# Patient Record
Sex: Female | Born: 1980 | Race: White | Hispanic: No | Marital: Married | Smoking: Never smoker
Health system: Southern US, Community
[De-identification: ages and names within clinical notes are randomized; demographics above are authoritative.]

## PROBLEM LIST (undated history)

## (undated) ENCOUNTER — Inpatient Hospital Stay (HOSPITAL_COMMUNITY): Payer: Self-pay

## (undated) DIAGNOSIS — Z789 Other specified health status: Secondary | ICD-10-CM

## (undated) DIAGNOSIS — Z87442 Personal history of urinary calculi: Secondary | ICD-10-CM

## (undated) HISTORY — PX: NO PAST SURGERIES: SHX2092

---

## 2012-03-15 LAB — OB RESULTS CONSOLE GC/CHLAMYDIA
Chlamydia: NEGATIVE
Gonorrhea: NEGATIVE

## 2012-03-15 LAB — OB RESULTS CONSOLE HIV ANTIBODY (ROUTINE TESTING): HIV: NONREACTIVE

## 2012-03-15 LAB — OB RESULTS CONSOLE RPR: RPR: NONREACTIVE

## 2012-03-15 LAB — OB RESULTS CONSOLE ANTIBODY SCREEN: Antibody Screen: NEGATIVE

## 2012-07-09 ENCOUNTER — Inpatient Hospital Stay (HOSPITAL_COMMUNITY): Payer: BC Managed Care – PPO

## 2012-07-09 ENCOUNTER — Encounter (HOSPITAL_COMMUNITY): Payer: Self-pay | Admitting: *Deleted

## 2012-07-09 ENCOUNTER — Observation Stay (HOSPITAL_COMMUNITY)
Admission: AD | Admit: 2012-07-09 | Discharge: 2012-07-11 | DRG: 886 | Disposition: A | Discharge status: Home / Self Care | Payer: BC Managed Care – PPO | Source: Ambulatory Visit | Attending: Obstetrics and Gynecology | Admitting: Obstetrics and Gynecology

## 2012-07-09 DIAGNOSIS — M545 Low back pain, unspecified: Secondary | ICD-10-CM | POA: Diagnosis present

## 2012-07-09 DIAGNOSIS — O26839 Pregnancy related renal disease, unspecified trimester: Principal | ICD-10-CM | POA: Diagnosis present

## 2012-07-09 DIAGNOSIS — N1330 Unspecified hydronephrosis: Secondary | ICD-10-CM | POA: Diagnosis present

## 2012-07-09 DIAGNOSIS — N200 Calculus of kidney: Secondary | ICD-10-CM | POA: Diagnosis present

## 2012-07-09 DIAGNOSIS — R109 Unspecified abdominal pain: Secondary | ICD-10-CM | POA: Diagnosis present

## 2012-07-09 HISTORY — DX: Other specified health status: Z78.9

## 2012-07-09 LAB — COMPREHENSIVE METABOLIC PANEL
ALT: 16 U/L (ref 0–35)
Alkaline Phosphatase: 50 U/L (ref 39–117)
BUN: 10 mg/dL (ref 6–23)
CO2: 20 mEq/L (ref 19–32)
Calcium: 8.5 mg/dL (ref 8.4–10.5)
GFR calc Af Amer: >90 mL/min (ref >90)
GFR calc non Af Amer: >90 mL/min (ref >90)
Glucose, Bld: 97 mg/dL (ref 70–99)
Potassium: 4.1 mEq/L (ref 3.5–5.1)
Sodium: 134 mEq/L — ABNORMAL LOW (ref 135–145)
Total Protein: 6.4 g/dL (ref 6.0–8.3)

## 2012-07-09 LAB — CBC
Hemoglobin: 11.7 g/dL — ABNORMAL LOW (ref 12.0–15.0)
MCH: 31.2 pg (ref 26.0–34.0)
RBC: 3.75 MIL/uL — ABNORMAL LOW (ref 3.87–5.11)

## 2012-07-09 LAB — URINE MICROSCOPIC-ADD ON

## 2012-07-09 LAB — URINALYSIS, ROUTINE W REFLEX MICROSCOPIC
Bilirubin Urine: NEGATIVE
Glucose, UA: NEGATIVE mg/dL
Ketones, ur: NEGATIVE mg/dL
Protein, ur: 30 mg/dL — AB
pH: 7.5 (ref 5.0–8.0)

## 2012-07-09 MED ORDER — DEXTROSE 5 % IV SOLN
1.0000 g | Freq: Once | INTRAVENOUS | Status: AC
  Administered 2012-07-09: 1 g via INTRAVENOUS
  Filled 2012-07-09: qty 10

## 2012-07-09 MED ORDER — PRENATAL MULTIVITAMIN CH: 1.0000 | ORAL_TABLET | Freq: Every day | ORAL | Status: DC

## 2012-07-09 MED ORDER — DIPHENHYDRAMINE HCL 50 MG/ML IJ SOLN: 12.5000 mg | Freq: Four times a day (QID) | INTRAMUSCULAR | Status: DC | PRN

## 2012-07-09 MED ORDER — SODIUM CHLORIDE 0.9 % IJ SOLN: 9.0000 mL | INTRAMUSCULAR | Status: DC | PRN

## 2012-07-09 MED ORDER — HYDROMORPHONE 0.3 MG/ML IV SOLN
INTRAVENOUS | Status: DC
  Administered 2012-07-09: 0.4 mg via INTRAVENOUS
  Administered 2012-07-09: 16:00:00 via INTRAVENOUS
  Administered 2012-07-09: 0.599 mg via INTRAVENOUS
  Administered 2012-07-10: 0.199 mg via INTRAVENOUS
  Administered 2012-07-10 (×2): 0.2 mg via INTRAVENOUS
  Administered 2012-07-10: 0.3999 mg via INTRAVENOUS
  Administered 2012-07-10: 0.4 mg via INTRAVENOUS
  Filled 2012-07-09: qty 25

## 2012-07-09 MED ORDER — NALOXONE HCL 0.4 MG/ML IJ SOLN: 0.4000 mg | INTRAMUSCULAR | Status: DC | PRN

## 2012-07-09 MED ORDER — DEXTROSE 5 % IV SOLN: 1.0000 g | Freq: Two times a day (BID) | INTRAVENOUS | Status: DC

## 2012-07-09 MED ORDER — DIPHENHYDRAMINE HCL 12.5 MG/5ML PO ELIX
12.5000 mg | ORAL_SOLUTION | Freq: Four times a day (QID) | ORAL | Status: DC | PRN
  Filled 2012-07-09: qty 5

## 2012-07-09 MED ORDER — ACETAMINOPHEN 325 MG PO TABS: 650.0000 mg | ORAL_TABLET | ORAL | Status: DC | PRN

## 2012-07-09 MED ORDER — OXYCODONE-ACETAMINOPHEN 5-325 MG PO TABS
2.0000 | ORAL_TABLET | Freq: Once | ORAL | Status: AC
  Administered 2012-07-09: 2 via ORAL
  Filled 2012-07-09: qty 2

## 2012-07-09 MED ORDER — DOCUSATE SODIUM 100 MG PO CAPS: 100.0000 mg | ORAL_CAPSULE | Freq: Every day | ORAL | Status: DC

## 2012-07-09 MED ORDER — CALCIUM CARBONATE ANTACID 500 MG PO CHEW: 2.0000 | CHEWABLE_TABLET | ORAL | Status: DC | PRN

## 2012-07-09 MED ORDER — ONDANSETRON HCL 4 MG/2ML IJ SOLN: 4.0000 mg | Freq: Four times a day (QID) | INTRAMUSCULAR | Status: DC | PRN

## 2012-07-09 MED ORDER — SODIUM CHLORIDE 0.9 % IV SOLN
INTRAVENOUS | Status: DC
  Administered 2012-07-09 – 2012-07-11 (×6): via INTRAVENOUS

## 2012-07-09 MED ORDER — DEXTROSE 5 % IV SOLN
1.0000 g | Freq: Two times a day (BID) | INTRAVENOUS | Status: DC
  Administered 2012-07-09 – 2012-07-10 (×3): 1 g via INTRAVENOUS
  Filled 2012-07-09 (×4): qty 10

## 2012-07-09 MED ORDER — ZOLPIDEM TARTRATE 5 MG PO TABS: 5.0000 mg | ORAL_TABLET | Freq: Every evening | ORAL | Status: DC | PRN

## 2012-07-09 MED ORDER — LACTATED RINGERS IV BOLUS (SEPSIS)
1000.0000 mL | Freq: Once | INTRAVENOUS | Status: DC
  Administered 2012-07-09: 1000 mL via INTRAVENOUS

## 2012-07-09 MED ORDER — SODIUM CHLORIDE 0.9 % IV BOLUS (SEPSIS)
1000.0000 mL | Freq: Once | INTRAVENOUS | Status: AC
  Administered 2012-07-09: 1000 mL via INTRAVENOUS

## 2012-07-09 NOTE — H&P (Signed)
HPI  ptis [redacted]w[redacted]d pregnant G1P0 who presents with sudden onset of lower left back pain at 3 am. The pain localized over left hip with some radiation upwards and towards spine. The pt took 1 Tylenol this morning at 3 am without any relief and then vomited this morning. Pt denies spotting, bleeding, contractions or UTI symptoms. She last had IC 2 weeks ago. Pt does not have a history of back pain. The pain is actually slightly improved with walking. The pt has had an uncomplicated pregnancy.  Past Medical History   Diagnosis  Date   .  No pertinent past medical history     Past Surgical History   Procedure  Date   .  No past surgeries     History reviewed. No pertinent family history.  History   Substance Use Topics   .  Smoking status:  Never Smoker   .  Smokeless tobacco:  Not on file   .  Alcohol Use:  No    Allergies: No Known Allergies  Prescriptions prior to admission   Medication  Sig  Dispense  Refill   .  acetaminophen (TYLENOL) 500 MG tablet  Take 500 mg by mouth every 6 (six) hours as needed. pain     .  Prenatal Vit-Fe Fumarate-FA (PRENATAL MULTIVITAMIN) TABS  Take 1 tablet by mouth daily.      Review of Systems  Constitutional: Negative for fever and chills.  Gastrointestinal: Positive for nausea and vomiting. Negative for heartburn, abdominal pain, diarrhea and constipation.  Genitourinary: Negative for dysuria and flank pain.  Musculoskeletal: Positive for back pain.   Physical Exam   Blood pressure 132/82, pulse 87, temperature 97.9 F (36.6 C), temperature source Oral, resp. rate 18, height 5\' 3"  (1.6 m), weight 150 lb 12.8 oz (68.402 kg).  Physical Exam  Nursing note and vitals reviewed.  Constitutional: She is oriented to person, place, and time. She appears well-developed and well-nourished.  Very uncomfortable appearing  HENT:  Head: Normocephalic.  Eyes: Pupils are equal, round, and reactive to light.  Neck: Normal range of motion. Neck supple.   Cardiovascular: Normal rate.  Respiratory: Effort normal.  GI: Soft. She exhibits no distension. There is no tenderness. There is no rebound.  Left CVA tenderness No ctx noted FHR reassuring for gestational age  Genitourinary:  Cervix long and closed  Musculoskeletal: Normal range of motion.  Neurological: She is alert and oriented to person, place, and time.  Skin: Skin is warm and dry. There is pallor.  Psychiatric: She has a normal mood and affect.   MAU Course   Procedures  Results for orders placed during the hospital encounter of 07/09/12 (from the past 24 hour(s))   URINALYSIS, ROUTINE W REFLEX MICROSCOPIC Status: Abnormal    Collection Time    07/09/12 9:20 AM   Component  Value  Range    Color, Urine  YELLOW  YELLOW    APPearance  CLOUDY (*)  CLEAR    Specific Gravity, Urine  1.015  1.005 - 1.030    pH  7.5  5.0 - 8.0    Glucose, UA  NEGATIVE  NEGATIVE mg/dL    Hgb urine dipstick  LARGE (*)  NEGATIVE    Bilirubin Urine  NEGATIVE  NEGATIVE    Ketones, ur  NEGATIVE  NEGATIVE mg/dL    Protein, ur  30 (*)  NEGATIVE mg/dL    Urobilinogen, UA  1.0  0.0 - 1.0 mg/dL    Nitrite  NEGATIVE  NEGATIVE    Leukocytes, UA  MODERATE (*)  NEGATIVE   URINE MICROSCOPIC-ADD ON Status: Abnormal    Collection Time    07/09/12 9:20 AM   Component  Value  Range    Squamous Epithelial / LPF  MANY (*)  RARE    WBC, UA  7-10  <3 WBC/hpf    RBC / HPF  TOO NUMEROUS TO COUNT  <3 RBC/hpf    Bacteria, UA  MANY (*)  RARE   discussed with Dr. Rana Snare- will give Rocephin 1 gm IV and IV bolus of fluids and do renal ultrasound  Urine culture pending  RADIOLOGY REPORT*  Clinical Data: [redacted] weeks pregnant with right flank pain.  RENAL/URINARY TRACT ULTRASOUND COMPLETE  Comparison: None.  Findings:  Right Kidney: 10.4 cm in length. A 4.5 mm intra pole calculus is  noted with associated acoustic shadowing. No hydronephrosis is  demonstrated. No perinephric fluid collections. The renal  parenchyma appears  normal. A right ureteral jet is documented.  Left Kidney: Small renal calculus suspected. Mild hydronephrosis  is present. No left-sided ureteral jet is visualized.  Bladder: Normal.  IMPRESSION:  1. Small bilateral renal calculi.  2. Mild left-sided hydronephrosis.  3. A right ureteral jet is noted. No left-sided ureteral jet is  documented.  Original Report Authenticated By: Rudie Meyer, M.D.  Assessment and Plan   Renal calculi with left sided hydronephrosis and possible obstruction  Possible pyelonephritis  Pt will be admitted for pain management and antibiotcs  LINEBERRY,SUSAN  07/09/2012, 9:58 AM    Pt is comfortable with Dilaudid PCA Pain is intermittant in Left lower back VSSAF  Minimal left flank pain on exam  Probable nephro/ureterlithiaisi - cont inpatient management of pain, IV fluids, strain urine. Because of leukouria, will continue with IV rocephin.  R/O Kidney infection.  Urine C&S pending.  Check CBC DL

## 2012-07-09 NOTE — MAU Note (Signed)
Pt reports having a ache sharp pain in her lower left back that started around 3am last night. Pain is constant. Denies vag bleeding or discharge and denies any UTI symptoms.

## 2012-07-09 NOTE — MAU Provider Note (Signed)
History     CSN: [id number]  Arrival date and time: 07/09/12 6213   First Provider Initiated Contact with Patient 07/09/12 [phone]      Chief Complaint  Patient presents with  . Back Pain   HPI ptis [redacted]w[redacted]d pregnant G1P0 who presents with sudden onset of lower left back pain at 3 am. The pain  localized over left hip with some radiation upwards and towards spine.  The pt took 1 Tylenol this morning at 3 am without any relief and then vomited this morning.  Pt denies spotting, bleeding, contractions or UTI symptoms.  She last had IC 2 weeks ago.  Pt does not have a history of back pain.  The pain is actually slightly improved with walking.  The pt has had an uncomplicated pregnancy.  Past Medical History  Diagnosis Date  . No pertinent past medical history     Past Surgical History  Procedure Date  . No past surgeries     History reviewed. No pertinent family history.  History  Substance Use Topics  . Smoking status: Never Smoker   . Smokeless tobacco: Not on file  . Alcohol Use: No    Allergies: No Known Allergies  Prescriptions prior to admission  Medication Sig Dispense Refill  . acetaminophen (TYLENOL) 500 MG tablet Take 500 mg by mouth every 6 (six) hours as needed. pain      . Prenatal Vit-Fe Fumarate-FA (PRENATAL MULTIVITAMIN) TABS Take 1 tablet by mouth daily.        Review of Systems  Constitutional: Negative for fever and chills.  Gastrointestinal: Positive for nausea and vomiting. Negative for heartburn, abdominal pain, diarrhea and constipation.  Genitourinary: Negative for dysuria and flank pain.  Musculoskeletal: Positive for back pain.   Physical Exam   Blood pressure 132/82, pulse 87, temperature 97.9 F (36.6 C), temperature source Oral, resp. rate 18, height 5\' 3"  (1.6 m), weight 150 lb 12.8 oz (68.402 kg).  Physical Exam  Nursing note and vitals reviewed. Constitutional: She is oriented to person, place, and time. She appears well-developed and  well-nourished.       Very uncomfortable appearing  HENT:  Head: Normocephalic.  Eyes: Pupils are equal, round, and reactive to light.  Neck: Normal range of motion. Neck supple.  Cardiovascular: Normal rate.   Respiratory: Effort normal.  GI: Soft. She exhibits no distension. There is no tenderness. There is no rebound.       Left CVA tenderness No ctx noted FHR reassuring for gestational age  Genitourinary:       Cervix long and closed  Musculoskeletal: Normal range of motion.  Neurological: She is alert and oriented to person, place, and time.  Skin: Skin is warm and dry. There is pallor.  Psychiatric: She has a normal mood and affect.    MAU Course  Procedures Results for orders placed during the hospital encounter of 07/09/12 (from the past 24 hour(s))  URINALYSIS, ROUTINE W REFLEX MICROSCOPIC     Status: Abnormal   Collection Time   07/09/12  9:20 AM      Component Value Range   Color, Urine YELLOW  YELLOW   APPearance CLOUDY (*) CLEAR   Specific Gravity, Urine 1.015  1.005 - 1.030   pH 7.5  5.0 - 8.0   Glucose, UA NEGATIVE  NEGATIVE mg/dL   Hgb urine dipstick LARGE (*) NEGATIVE   Bilirubin Urine NEGATIVE  NEGATIVE   Ketones, ur NEGATIVE  NEGATIVE mg/dL   Protein, ur 30 (*)  NEGATIVE mg/dL   Urobilinogen, UA 1.0  0.0 - 1.0 mg/dL   Nitrite NEGATIVE  NEGATIVE   Leukocytes, UA MODERATE (*) NEGATIVE  URINE MICROSCOPIC-ADD ON     Status: Abnormal   Collection Time   07/09/12  9:20 AM      Component Value Range   Squamous Epithelial / LPF MANY (*) RARE   WBC, UA 7-10  <3 WBC/hpf   RBC / HPF TOO NUMEROUS TO COUNT  <3 RBC/hpf   Bacteria, UA MANY (*) RARE  discussed with Dr. Rana Snare- will give Rocephin 1 gm IV and IV bolus of fluids and do renal ultrasound Urine culture pending RADIOLOGY REPORT*  Clinical Data: [redacted] weeks pregnant with right flank pain.  RENAL/URINARY TRACT ULTRASOUND COMPLETE  Comparison: None.  Findings:  Right Kidney: 10.4 cm in length. A 4.5 mm intra  pole calculus is  noted with associated acoustic shadowing. No hydronephrosis is  demonstrated. No perinephric fluid collections. The renal  parenchyma appears normal. A right ureteral jet is documented.  Left Kidney: Small renal calculus suspected. Mild hydronephrosis  is present. No left-sided ureteral jet is visualized.  Bladder: Normal.  IMPRESSION:  1. Small bilateral renal calculi.  2. Mild left-sided hydronephrosis.  3. A right ureteral jet is noted. No left-sided ureteral jet is  documented.  Original Report Authenticated By: Rudie Meyer, M.D.   Assessment and Plan  Renal calculi with left sided hydronephrosis and possible obstruction Possible pyelonephritis Pt will be admitted for pain management and antibiotcs  Garet Hooton 07/09/2012, 9:58 AM

## 2012-07-10 LAB — URINE CULTURE

## 2012-07-10 MED ORDER — HYDROCODONE-ACETAMINOPHEN 5-325 MG PO TABS: 1.0000 | ORAL_TABLET | Freq: Four times a day (QID) | ORAL | Status: DC | PRN

## 2012-07-10 NOTE — Progress Notes (Addendum)
Patient ID: Katie Deleon, female   DOB: 01-12-1981, 31 y.o.   MRN: [id number] Pt has more intermittant pain on left For 4 hours this am didn't need meds Now, pain resuming in left flank VSSAF WBC elevated yesterday FHR 150s Ctx none Left flank 1/5 tenderness  26 4/7 with Pyleo v nephrolithiasis continue IV abx, IVF, PCA dilaudid, recheck  CBC in am DL

## 2012-07-10 NOTE — Progress Notes (Signed)
Passed one small stone clear yellow urine.  Denies any discomfort at this time.

## 2012-07-11 LAB — CBC WITH DIFFERENTIAL/PLATELET
Eosinophils Relative: 1 % (ref 0–5)
HCT: 29.7 % — ABNORMAL LOW (ref 36.0–46.0)
Hemoglobin: 10 g/dL — ABNORMAL LOW (ref 12.0–15.0)
Lymphocytes Relative: 15 % (ref 12–46)
Lymphs Abs: 1.5 10*3/uL (ref 0.7–4.0)
MCV: 92.8 fL (ref 78.0–100.0)
Monocytes Absolute: 1.1 10*3/uL — ABNORMAL HIGH (ref 0.1–1.0)
Monocytes Relative: 11 % (ref 3–12)
RBC: 3.2 MIL/uL — ABNORMAL LOW (ref 3.87–5.11)
RDW: 13.7 % (ref 11.5–15.5)
WBC: 9.9 10*3/uL (ref 4.0–10.5)

## 2012-07-11 MED ORDER — CEPHALEXIN 500 MG PO CAPS: 500.0000 mg | ORAL_CAPSULE | Freq: Four times a day (QID) | ORAL | Status: DC

## 2012-07-11 NOTE — Progress Notes (Signed)
Patient ID: Katie Deleon, female   DOB: 14-Dec-1980, 31 y.o.   MRN: [id number] [redacted]w[redacted]d S//feeling much improved  O//BP 107/66  Pulse 89  Temp 97.4 F (36.3 C) (Oral)  Resp 20  Ht 5\' 3"  (1.6 m)  Wt 150 lb 12.8 oz (68.402 kg)  BMI 26.71 kg/m2  SpO2 98%  C&S>prob contam  Abd soft, + BS, FHR 140's, nontender  A+P// kidney stone, passed, pt much improved>>D/C today

## 2012-07-11 NOTE — Discharge Summary (Signed)
Physician Discharge Summary  Patient ID: Katie Deleon MRN: [id number] DOB/AGE: [birth year] 31 y.o.  Admit date: 07/09/2012 Discharge date: 07/11/2012  Admission Diagnoses:26 week preg     nephrolithiasis  Discharge Diagnoses: same Active Problems:  * No active hospital problems. *    Discharged Condition: good  Hospital Course: adm with flank pain, poss pyelonephritis, rec'd MS PCA and IV Rocephin, during stay , passed stone with immediate relief, C&S prob contam  Consults: None  Significant Diagnostic Studies: labs:  Results for orders placed during the hospital encounter of 07/09/12 (from the past 48 hour(s))  URINALYSIS, ROUTINE W REFLEX MICROSCOPIC     Status: Abnormal   Collection Time   07/09/12  9:20 AM      Component Value Range Comment   Color, Urine YELLOW  YELLOW    APPearance CLOUDY (*) CLEAR    Specific Gravity, Urine 1.015  1.005 - 1.030    pH 7.5  5.0 - 8.0    Glucose, UA NEGATIVE  NEGATIVE mg/dL    Hgb urine dipstick LARGE (*) NEGATIVE    Bilirubin Urine NEGATIVE  NEGATIVE    Ketones, ur NEGATIVE  NEGATIVE mg/dL    Protein, ur 30 (*) NEGATIVE mg/dL    Urobilinogen, UA 1.0  0.0 - 1.0 mg/dL    Nitrite NEGATIVE  NEGATIVE    Leukocytes, UA MODERATE (*) NEGATIVE   URINE MICROSCOPIC-ADD ON     Status: Abnormal   Collection Time   07/09/12  9:20 AM      Component Value Range Comment   Squamous Epithelial / LPF MANY (*) RARE    WBC, UA 7-10  <3 WBC/hpf    RBC / HPF TOO NUMEROUS TO COUNT  <3 RBC/hpf    Bacteria, UA MANY (*) RARE   URINE CULTURE     Status: Normal   Collection Time   07/09/12  9:20 AM      Component Value Range Comment   Specimen Description URINE, CLEAN CATCH      Special Requests NONE      Culture  Setup Time 07/09/2012 13:29      Colony Count 20,OOO COLONIES/ML      Culture        Value: Multiple bacterial morphotypes present, none predominant. Suggest appropriate recollection if clinically indicated.   Report Status 07/10/2012 FINAL      COMPREHENSIVE METABOLIC PANEL     Status: Abnormal   Collection Time   07/09/12  2:36 PM      Component Value Range Comment   Sodium 134 (*) 135 - 145 mEq/L    Potassium 4.1  3.5 - 5.1 mEq/L    Chloride 103  96 - 112 mEq/L    CO2 20  19 - 32 mEq/L    Glucose, Bld 97  70 - 99 mg/dL    BUN 10  6 - 23 mg/dL    Creatinine, Ser 5.62  0.50 - 1.10 mg/dL    Calcium 8.5  8.4 - 13.0 mg/dL    Total Protein 6.4  6.0 - 8.3 g/dL    Albumin 2.9 (*) 3.5 - 5.2 g/dL    AST 21  0 - 37 U/L    ALT 16  0 - 35 U/L    Alkaline Phosphatase 50  39 - 117 U/L    Total Bilirubin 0.2 (*) 0.3 - 1.2 mg/dL    GFR calc non Af Amer >90  >90 mL/min    GFR calc Af Amer >90  >90 mL/min  CBC     Status: Abnormal   Collection Time   07/09/12  2:36 PM      Component Value Range Comment   WBC 17.0 (*) 4.0 - 10.5 K/uL    RBC 3.75 (*) 3.87 - 5.11 MIL/uL    Hemoglobin 11.7 (*) 12.0 - 15.0 g/dL    HCT 09.8 (*) 11.9 - 46.0 %    MCV 91.5  78.0 - 100.0 fL    MCH 31.2  26.0 - 34.0 pg    MCHC 34.1  30.0 - 36.0 g/dL    RDW 14.7  82.9 - 56.2 %    Platelets 202  150 - 400 K/uL   CBC WITH DIFFERENTIAL     Status: Abnormal   Collection Time   07/11/12  5:40 AM      Component Value Range Comment   WBC 9.9  4.0 - 10.5 K/uL    RBC 3.20 (*) 3.87 - 5.11 MIL/uL    Hemoglobin 10.0 (*) 12.0 - 15.0 g/dL    HCT 13.0 (*) 86.5 - 46.0 %    MCV 92.8  78.0 - 100.0 fL    MCH 31.3  26.0 - 34.0 pg    MCHC 33.7  30.0 - 36.0 g/dL    RDW 78.4  69.6 - 29.5 %    Platelets 178  150 - 400 K/uL    Neutrophils Relative 73  43 - 77 %    Neutro Abs 7.2  1.7 - 7.7 K/uL    Lymphocytes Relative 15  12 - 46 %    Lymphs Abs 1.5  0.7 - 4.0 K/uL    Monocytes Relative 11  3 - 12 %    Monocytes Absolute 1.1 (*) 0.1 - 1.0 K/uL    Eosinophils Relative 1  0 - 5 %    Eosinophils Absolute 0.1  0.0 - 0.7 K/uL    Basophils Relative 0  0 - 1 %    Basophils Absolute 0.0  0.0 - 0.1 K/uL     Treatments: IV hydration, antibiotics: ceftriaxone and analgesia:  acetaminophen and Morphine  Discharge Exam: Blood pressure 107/66, pulse 89, temperature 97.4 F (36.3 C), temperature source Oral, resp. rate 20, height 5\' 3"  (1.6 m), weight 150 lb 12.8 oz (68.402 kg), SpO2 98.00%. no CVAT, abd benign  Disposition: Final discharge disposition not confirmed     Medication List     As of 07/11/2012  8:23 AM    TAKE these medications         acetaminophen 500 MG tablet   Commonly known as: TYLENOL   Take 500 mg by mouth every 6 (six) hours as needed. pain      cephALEXin 500 MG capsule   Commonly known as: KEFLEX   Take 1 capsule (500 mg total) by mouth 4 (four) times daily.      prenatal multivitamin Tabs   Take 1 tablet by mouth daily.           Follow-up Information    Follow up with Meriel Pica, MD. (has appt Dec 17)    Contact information:   [address] [place] [address] [phone]          Signed: Meriel Pica 07/11/2012, 8:23 AM

## 2012-07-13 LAB — STONE ANALYSIS: Stone Weight KSTONE: 0.017 g

## 2012-07-19 ENCOUNTER — Other Ambulatory Visit (HOSPITAL_COMMUNITY): Payer: Self-pay | Admitting: Obstetrics and Gynecology

## 2012-07-19 DIAGNOSIS — N200 Calculus of kidney: Secondary | ICD-10-CM

## 2012-07-19 DIAGNOSIS — Z09 Encounter for follow-up examination after completed treatment for conditions other than malignant neoplasm: Secondary | ICD-10-CM

## 2012-07-22 ENCOUNTER — Ambulatory Visit (HOSPITAL_COMMUNITY)
Admission: RE | Admit: 2012-07-22 | Discharge: 2012-07-22 | Disposition: A | Discharge status: Home / Self Care | Payer: BC Managed Care – PPO | Source: Ambulatory Visit | Attending: Obstetrics and Gynecology | Admitting: Obstetrics and Gynecology

## 2012-07-22 DIAGNOSIS — Z09 Encounter for follow-up examination after completed treatment for conditions other than malignant neoplasm: Secondary | ICD-10-CM

## 2012-07-22 DIAGNOSIS — N200 Calculus of kidney: Secondary | ICD-10-CM | POA: Insufficient documentation

## 2012-07-22 DIAGNOSIS — O26839 Pregnancy related renal disease, unspecified trimester: Secondary | ICD-10-CM | POA: Insufficient documentation

## 2012-09-03 ENCOUNTER — Inpatient Hospital Stay (HOSPITAL_COMMUNITY): Admission: AD | Admit: 2012-09-03 | Payer: Self-pay | Source: Ambulatory Visit | Admitting: Obstetrics and Gynecology

## 2012-10-16 ENCOUNTER — Inpatient Hospital Stay (HOSPITAL_COMMUNITY)
Admission: RE | Admit: 2012-10-16 | Discharge: 2012-10-20 | DRG: 371 | Disposition: A | Discharge status: Home / Self Care | Payer: BC Managed Care – PPO | Source: Ambulatory Visit | Attending: Obstetrics and Gynecology | Admitting: Obstetrics and Gynecology

## 2012-10-16 ENCOUNTER — Encounter (HOSPITAL_COMMUNITY): Payer: Self-pay

## 2012-10-16 VITALS — BP 108/72 | HR 65 | Temp 98.9°F | Resp 18 | Ht 63.0 in | Wt 170.0 lb

## 2012-10-16 DIAGNOSIS — Z98891 History of uterine scar from previous surgery: Secondary | ICD-10-CM

## 2012-10-16 DIAGNOSIS — O324XX0 Maternal care for high head at term, not applicable or unspecified: Secondary | ICD-10-CM | POA: Diagnosis present

## 2012-10-16 DIAGNOSIS — O411090 Infection of amniotic sac and membranes, unspecified, unspecified trimester, not applicable or unspecified: Secondary | ICD-10-CM | POA: Diagnosis present

## 2012-10-16 DIAGNOSIS — O480 Post-term pregnancy: Principal | ICD-10-CM | POA: Diagnosis present

## 2012-10-16 LAB — CBC
Hemoglobin: 12.7 g/dL (ref 12.0–15.0)
MCH: 31.7 pg (ref 26.0–34.0)
RBC: 4.01 MIL/uL (ref 3.87–5.11)

## 2012-10-16 MED ORDER — ACETAMINOPHEN 325 MG PO TABS: 650.0000 mg | ORAL_TABLET | ORAL | Status: DC | PRN

## 2012-10-16 MED ORDER — LIDOCAINE HCL (PF) 1 % IJ SOLN
30.0000 mL | INTRAMUSCULAR | Status: DC | PRN
  Filled 2012-10-16: qty 30

## 2012-10-16 MED ORDER — OXYTOCIN 40 UNITS IN LACTATED RINGERS INFUSION - SIMPLE MED: 62.5000 mL/h | INTRAVENOUS | Status: DC

## 2012-10-16 MED ORDER — OXYCODONE-ACETAMINOPHEN 5-325 MG PO TABS: 1.0000 | ORAL_TABLET | ORAL | Status: DC | PRN

## 2012-10-16 MED ORDER — TERBUTALINE SULFATE 1 MG/ML IJ SOLN: 0.2500 mg | Freq: Once | INTRAMUSCULAR | Status: AC | PRN

## 2012-10-16 MED ORDER — ONDANSETRON HCL 4 MG/2ML IJ SOLN: 4.0000 mg | Freq: Four times a day (QID) | INTRAMUSCULAR | Status: DC | PRN

## 2012-10-16 MED ORDER — CITRIC ACID-SODIUM CITRATE 334-500 MG/5ML PO SOLN
30.0000 mL | ORAL | Status: DC | PRN
  Administered 2012-10-18: 30 mL via ORAL
  Filled 2012-10-16: qty 15

## 2012-10-16 MED ORDER — LACTATED RINGERS IV SOLN
INTRAVENOUS | Status: DC
  Administered 2012-10-17 – 2012-10-18 (×3): via INTRAVENOUS

## 2012-10-16 MED ORDER — LACTATED RINGERS IV SOLN: 500.0000 mL | INTRAVENOUS | Status: DC | PRN

## 2012-10-16 MED ORDER — MISOPROSTOL 25 MCG QUARTER TABLET
25.0000 ug | ORAL_TABLET | ORAL | Status: DC | PRN
  Administered 2012-10-16 – 2012-10-17 (×2): 25 ug via VAGINAL
  Filled 2012-10-16 (×2): qty 0.25

## 2012-10-16 MED ORDER — IBUPROFEN 600 MG PO TABS: 600.0000 mg | ORAL_TABLET | Freq: Four times a day (QID) | ORAL | Status: DC | PRN

## 2012-10-16 MED ORDER — OXYTOCIN BOLUS FROM INFUSION: 500.0000 mL | INTRAVENOUS | Status: DC

## 2012-10-16 MED ORDER — ZOLPIDEM TARTRATE 5 MG PO TABS: 5.0000 mg | ORAL_TABLET | Freq: Every evening | ORAL | Status: DC | PRN

## 2012-10-17 ENCOUNTER — Encounter (HOSPITAL_COMMUNITY): Payer: Self-pay

## 2012-10-17 LAB — TYPE AND SCREEN
ABO/RH(D): AB POS
Antibody Screen: NEGATIVE

## 2012-10-17 LAB — RPR: RPR Ser Ql: NONREACTIVE

## 2012-10-17 MED ORDER — FENTANYL 2.5 MCG/ML BUPIVACAINE 1/10 % EPIDURAL INFUSION (WH - ANES)
14.0000 mL/h | INTRAMUSCULAR | Status: DC | PRN
  Administered 2012-10-17 (×3): 14 mL/h via EPIDURAL
  Filled 2012-10-17 (×3): qty 125

## 2012-10-17 MED ORDER — TERBUTALINE SULFATE 1 MG/ML IJ SOLN: 0.2500 mg | Freq: Once | INTRAMUSCULAR | Status: AC | PRN

## 2012-10-17 MED ORDER — DIPHENHYDRAMINE HCL 50 MG/ML IJ SOLN: 12.5000 mg | INTRAMUSCULAR | Status: DC | PRN

## 2012-10-17 MED ORDER — GENTAMICIN SULFATE 40 MG/ML IJ SOLN
150.0000 mg | Freq: Three times a day (TID) | INTRAVENOUS | Status: DC
  Administered 2012-10-17 – 2012-10-18 (×2): 150 mg via INTRAVENOUS
  Filled 2012-10-17 (×4): qty 3.75

## 2012-10-17 MED ORDER — PHENYLEPHRINE 40 MCG/ML (10ML) SYRINGE FOR IV PUSH (FOR BLOOD PRESSURE SUPPORT): 80.0000 ug | PREFILLED_SYRINGE | INTRAVENOUS | Status: DC | PRN

## 2012-10-17 MED ORDER — LACTATED RINGERS IV SOLN: 500.0000 mL | Freq: Once | INTRAVENOUS | Status: DC

## 2012-10-17 MED ORDER — ACETAMINOPHEN 500 MG PO TABS
1000.0000 mg | ORAL_TABLET | Freq: Once | ORAL | Status: AC
  Administered 2012-10-17: 1000 mg via ORAL
  Filled 2012-10-17: qty 2

## 2012-10-17 MED ORDER — EPHEDRINE 5 MG/ML INJ: 10.0000 mg | INTRAVENOUS | Status: DC | PRN

## 2012-10-17 MED ORDER — EPHEDRINE 5 MG/ML INJ
10.0000 mg | INTRAVENOUS | Status: DC | PRN
  Filled 2012-10-17: qty 4

## 2012-10-17 MED ORDER — OXYTOCIN 40 UNITS IN LACTATED RINGERS INFUSION - SIMPLE MED
1.0000 m[IU]/min | INTRAVENOUS | Status: DC
  Administered 2012-10-17: 2 m[IU]/min via INTRAVENOUS
  Administered 2012-10-17: 14 m[IU]/min via INTRAVENOUS
  Administered 2012-10-18: 20 m[IU]/min via INTRAVENOUS
  Administered 2012-10-18: 18 m[IU]/min via INTRAVENOUS
  Administered 2012-10-18: 16 m[IU]/min via INTRAVENOUS
  Filled 2012-10-17: qty 1000

## 2012-10-17 MED ORDER — SODIUM BICARBONATE 8.4 % IV SOLN
INTRAVENOUS | Status: DC | PRN
  Administered 2012-10-17: 5 mL via EPIDURAL

## 2012-10-17 MED ORDER — PHENYLEPHRINE 40 MCG/ML (10ML) SYRINGE FOR IV PUSH (FOR BLOOD PRESSURE SUPPORT)
80.0000 ug | PREFILLED_SYRINGE | INTRAVENOUS | Status: DC | PRN
  Filled 2012-10-17: qty 5

## 2012-10-17 MED ORDER — SODIUM CHLORIDE 0.9 % IV SOLN
2.0000 g | Freq: Four times a day (QID) | INTRAVENOUS | Status: DC
  Administered 2012-10-17 – 2012-10-18 (×3): 2 g via INTRAVENOUS
  Filled 2012-10-17 (×5): qty 2000

## 2012-10-17 NOTE — Progress Notes (Signed)
More comfortable with epidural.  Chorio diagnosed at 5pm with T 101.1.   VSS with exception of T 100.5 FHT 170, mod var.  +accel.  -decel Toco q 2-3 min, pit 12 SVE 8/c/0 31yo G2P0010 at [redacted]w[redacted]d for PDI -Chorio-Amp/Gent/Tylenol -IOL-Cont pit

## 2012-10-17 NOTE — Anesthesia Preprocedure Evaluation (Signed)

## 2012-10-17 NOTE — Anesthesia Procedure Notes (Signed)

## 2012-10-17 NOTE — H&P (Signed)
Katie Deleon is a 32 y.o. female presenting for post-dates IOL.  +FM, no VB, SROM around 3am.  CTX irregular and every 2-5 minutes. Maternal Medical History:  Contractions: Onset was 1 week ago.   Frequency: rare.   Perceived severity is mild.    Fetal activity: Perceived fetal activity is normal.   Last perceived fetal movement was within the past hour.    Prenatal complications: no prenatal complications Prenatal Complications - Diabetes: none.    OB History   Grav Para Term Preterm Abortions TAB SAB Ect Mult Living   2 0 0 0 1 1 0 0 0 0      Past Medical History  Diagnosis Date  . No pertinent past medical history    Past Surgical History  Procedure Laterality Date  . No past surgeries     Family History: family history is not on file. Social History:  reports that she has never smoked. She does not have any smokeless tobacco history on file. She reports that she does not drink alcohol or use illicit drugs.   Prenatal Transfer Tool  Maternal Diabetes: No Genetic Screening: Normal Maternal Ultrasounds/Referrals: Normal Fetal Ultrasounds or other Referrals:  None Maternal Substance Abuse:  No Significant Maternal Medications:  None Significant Maternal Lab Results:  None Other Comments:  None  ROS  Dilation: 1 Effacement (%): 70 Station: -2 Exam by:: Fischer Halley Blood pressure 121/76, pulse 76, temperature 98.1 F (36.7 C), temperature source Oral, resp. rate 18, height 5\' 3"  (1.6 m), weight 170 lb (77.111 kg). Foley bulb placed without complication.  Maternal Exam:  Uterine Assessment: Contraction strength is mild.  Contraction frequency is rare.   Abdomen: Patient reports no abdominal tenderness. Fundal height is c/w dates.   Estimated fetal weight is 8#.   Fetal presentation: vertex  Introitus: Normal vulva. Normal vagina.  Pelvis: adequate for delivery.   Cervix: Cervix evaluated by digital exam.     Physical Exam  Constitutional: She is oriented to  person, place, and time. She appears well-developed and well-nourished.  GI: Soft. Bowel sounds are normal. There is no rebound and no guarding.  Genitourinary: Vagina normal and uterus normal.  Neurological: She is alert and oriented to person, place, and time.  Skin: Skin is warm and dry.  Psychiatric: She has a normal mood and affect. Her behavior is normal.    Prenatal labs: ABO, Rh: AB/Positive/-- (08/13 0000) Antibody: Negative (08/13 0000) Rubella: Immune (08/13 0000) RPR: NON REACTIVE (03/16 1940)  HBsAg: Negative (08/13 0000)  HIV: Non-reactive (08/13 0000)  GBS: Negative (02/12 0000)   Assessment/Plan: 31yo G2P0010 at [redacted]w[redacted]d for post-dates IOL -PDI: s/p VMP x 2 doses overnight, Foley bulb in place and will start pitocin if CTX space out -Epidural if desired -GBS negative  Sigismund Cross 10/17/2012, 8:12 AM

## 2012-10-18 ENCOUNTER — Encounter (HOSPITAL_COMMUNITY): Payer: Self-pay

## 2012-10-18 ENCOUNTER — Encounter (HOSPITAL_COMMUNITY): Payer: Self-pay | Admitting: Anesthesiology

## 2012-10-18 ENCOUNTER — Encounter (HOSPITAL_COMMUNITY)
Admission: RE | Disposition: A | Discharge status: Home / Self Care | Payer: Self-pay | Source: Ambulatory Visit | Attending: Obstetrics and Gynecology

## 2012-10-18 ENCOUNTER — Inpatient Hospital Stay (HOSPITAL_COMMUNITY): Payer: BC Managed Care – PPO | Admitting: Anesthesiology

## 2012-10-18 SURGERY — Surgical Case
Anesthesia: Epidural | Site: Abdomen | Wound class: Clean Contaminated

## 2012-10-18 MED ORDER — OXYTOCIN 10 UNIT/ML IJ SOLN
40.0000 [IU] | INTRAMUSCULAR | Status: DC | PRN
  Administered 2012-10-18: 40 [IU] via INTRAVENOUS

## 2012-10-18 MED ORDER — ZOLPIDEM TARTRATE 5 MG PO TABS: 5.0000 mg | ORAL_TABLET | Freq: Every evening | ORAL | Status: DC | PRN

## 2012-10-18 MED ORDER — SIMETHICONE 80 MG PO CHEW: 80.0000 mg | CHEWABLE_TABLET | ORAL | Status: DC | PRN

## 2012-10-18 MED ORDER — IBUPROFEN 600 MG PO TABS
600.0000 mg | ORAL_TABLET | Freq: Four times a day (QID) | ORAL | Status: DC
  Administered 2012-10-18 – 2012-10-20 (×9): 600 mg via ORAL
  Filled 2012-10-18 (×9): qty 1

## 2012-10-18 MED ORDER — KETOROLAC TROMETHAMINE 60 MG/2ML IM SOLN
60.0000 mg | Freq: Once | INTRAMUSCULAR | Status: AC | PRN
  Administered 2012-10-18: 60 mg via INTRAMUSCULAR

## 2012-10-18 MED ORDER — LACTATED RINGERS IV SOLN: INTRAVENOUS | Status: DC | PRN

## 2012-10-18 MED ORDER — 0.9 % SODIUM CHLORIDE (POUR BTL) OPTIME
TOPICAL | Status: DC | PRN
  Administered 2012-10-18: 400 mL

## 2012-10-18 MED ORDER — ONDANSETRON HCL 4 MG/2ML IJ SOLN
INTRAMUSCULAR | Status: DC | PRN
  Administered 2012-10-18: 4 mg via INTRAVENOUS

## 2012-10-18 MED ORDER — HYDROMORPHONE HCL PF 1 MG/ML IJ SOLN: 0.2500 mg | INTRAMUSCULAR | Status: DC | PRN

## 2012-10-18 MED ORDER — DIBUCAINE 1 % RE OINT: 1.0000 "application " | TOPICAL_OINTMENT | RECTAL | Status: DC | PRN

## 2012-10-18 MED ORDER — AMPICILLIN SODIUM 2 G IJ SOLR
2.0000 g | Freq: Once | INTRAMUSCULAR | Status: AC
  Administered 2012-10-18: 2 g via INTRAVENOUS
  Filled 2012-10-18: qty 2000

## 2012-10-18 MED ORDER — DIPHENHYDRAMINE HCL 25 MG PO CAPS: 25.0000 mg | ORAL_CAPSULE | Freq: Four times a day (QID) | ORAL | Status: DC | PRN

## 2012-10-18 MED ORDER — KETOROLAC TROMETHAMINE 30 MG/ML IJ SOLN: 30.0000 mg | Freq: Four times a day (QID) | INTRAMUSCULAR | Status: AC | PRN

## 2012-10-18 MED ORDER — LANOLIN HYDROUS EX OINT: 1.0000 "application " | TOPICAL_OINTMENT | CUTANEOUS | Status: DC | PRN

## 2012-10-18 MED ORDER — SIMETHICONE 80 MG PO CHEW
80.0000 mg | CHEWABLE_TABLET | Freq: Three times a day (TID) | ORAL | Status: DC
  Administered 2012-10-18 – 2012-10-20 (×8): 80 mg via ORAL

## 2012-10-18 MED ORDER — LIDOCAINE HCL (CARDIAC) 20 MG/ML IV SOLN
INTRAVENOUS | Status: AC
  Filled 2012-10-18: qty 5

## 2012-10-18 MED ORDER — NALOXONE HCL 0.4 MG/ML IJ SOLN: 0.4000 mg | INTRAMUSCULAR | Status: DC | PRN

## 2012-10-18 MED ORDER — KETOROLAC TROMETHAMINE 30 MG/ML IJ SOLN: 15.0000 mg | Freq: Once | INTRAMUSCULAR | Status: DC | PRN

## 2012-10-18 MED ORDER — LACTATED RINGERS IV SOLN
INTRAVENOUS | Status: DC | PRN
  Administered 2012-10-18 (×3): via INTRAVENOUS

## 2012-10-18 MED ORDER — OXYTOCIN 40 UNITS IN LACTATED RINGERS INFUSION - SIMPLE MED: 62.5000 mL/h | INTRAVENOUS | Status: AC

## 2012-10-18 MED ORDER — MEPERIDINE HCL 25 MG/ML IJ SOLN: 6.2500 mg | INTRAMUSCULAR | Status: DC | PRN

## 2012-10-18 MED ORDER — NALOXONE HCL 1 MG/ML IJ SOLN: 1.0000 ug/kg/h | INTRAVENOUS | Status: DC | PRN

## 2012-10-18 MED ORDER — LACTATED RINGERS IV SOLN
INTRAVENOUS | Status: DC
  Administered 2012-10-18: 16:00:00 via INTRAVENOUS

## 2012-10-18 MED ORDER — PRENATAL MULTIVITAMIN CH
1.0000 | ORAL_TABLET | Freq: Every day | ORAL | Status: DC
  Administered 2012-10-18 – 2012-10-19 (×2): 1 via ORAL
  Filled 2012-10-18 (×2): qty 1

## 2012-10-18 MED ORDER — OXYTOCIN 10 UNIT/ML IJ SOLN
INTRAMUSCULAR | Status: AC
  Filled 2012-10-18: qty 4

## 2012-10-18 MED ORDER — ONDANSETRON HCL 4 MG/2ML IJ SOLN
INTRAMUSCULAR | Status: AC
  Filled 2012-10-18: qty 2

## 2012-10-18 MED ORDER — SENNOSIDES-DOCUSATE SODIUM 8.6-50 MG PO TABS
2.0000 | ORAL_TABLET | Freq: Every day | ORAL | Status: DC
  Administered 2012-10-18 – 2012-10-19 (×2): 2 via ORAL

## 2012-10-18 MED ORDER — MORPHINE SULFATE (PF) 0.5 MG/ML IJ SOLN
INTRAMUSCULAR | Status: DC | PRN
  Administered 2012-10-18: 3000 ug via EPIDURAL
  Administered 2012-10-18: 2000 ug via INTRAVENOUS

## 2012-10-18 MED ORDER — WITCH HAZEL-GLYCERIN EX PADS: 1.0000 "application " | MEDICATED_PAD | CUTANEOUS | Status: DC | PRN

## 2012-10-18 MED ORDER — METOCLOPRAMIDE HCL 5 MG/ML IJ SOLN: 10.0000 mg | Freq: Three times a day (TID) | INTRAMUSCULAR | Status: DC | PRN

## 2012-10-18 MED ORDER — SCOPOLAMINE 1 MG/3DAYS TD PT72
1.0000 | MEDICATED_PATCH | Freq: Once | TRANSDERMAL | Status: DC
  Administered 2012-10-18: 1.5 mg via TRANSDERMAL

## 2012-10-18 MED ORDER — PROMETHAZINE HCL 25 MG/ML IJ SOLN: 6.2500 mg | INTRAMUSCULAR | Status: DC | PRN

## 2012-10-18 MED ORDER — OXYCODONE-ACETAMINOPHEN 5-325 MG PO TABS: 1.0000 | ORAL_TABLET | ORAL | Status: DC | PRN

## 2012-10-18 MED ORDER — SODIUM CHLORIDE 0.9 % IJ SOLN: 3.0000 mL | INTRAMUSCULAR | Status: DC | PRN

## 2012-10-18 MED ORDER — SCOPOLAMINE 1 MG/3DAYS TD PT72
MEDICATED_PATCH | TRANSDERMAL | Status: AC
  Filled 2012-10-18: qty 1

## 2012-10-18 MED ORDER — MORPHINE SULFATE 0.5 MG/ML IJ SOLN
INTRAMUSCULAR | Status: AC
  Filled 2012-10-18: qty 10

## 2012-10-18 MED ORDER — MENTHOL 3 MG MT LOZG: 1.0000 | LOZENGE | OROMUCOSAL | Status: DC | PRN

## 2012-10-18 MED ORDER — DIPHENHYDRAMINE HCL 25 MG PO CAPS: 25.0000 mg | ORAL_CAPSULE | ORAL | Status: DC | PRN

## 2012-10-18 MED ORDER — ONDANSETRON HCL 4 MG/2ML IJ SOLN: 4.0000 mg | INTRAMUSCULAR | Status: DC | PRN

## 2012-10-18 MED ORDER — ONDANSETRON HCL 4 MG/2ML IJ SOLN: 4.0000 mg | Freq: Three times a day (TID) | INTRAMUSCULAR | Status: DC | PRN

## 2012-10-18 MED ORDER — ONDANSETRON HCL 4 MG PO TABS: 4.0000 mg | ORAL_TABLET | ORAL | Status: DC | PRN

## 2012-10-18 MED ORDER — NALBUPHINE HCL 10 MG/ML IJ SOLN
5.0000 mg | INTRAMUSCULAR | Status: DC | PRN
  Filled 2012-10-18: qty 1

## 2012-10-18 MED ORDER — KETOROLAC TROMETHAMINE 60 MG/2ML IM SOLN
INTRAMUSCULAR | Status: AC
  Filled 2012-10-18: qty 2

## 2012-10-18 MED ORDER — DIPHENHYDRAMINE HCL 50 MG/ML IJ SOLN: 12.5000 mg | INTRAMUSCULAR | Status: DC | PRN

## 2012-10-18 MED ORDER — DEXTROSE 5 % IV SOLN
150.0000 mg | Freq: Once | INTRAVENOUS | Status: AC
  Administered 2012-10-18: 150 mg via INTRAVENOUS
  Filled 2012-10-18: qty 3.75

## 2012-10-18 MED ORDER — CLINDAMYCIN PHOSPHATE 900 MG/50ML IV SOLN
900.0000 mg | Freq: Once | INTRAVENOUS | Status: AC
  Administered 2012-10-18: 900 mg via INTRAVENOUS
  Filled 2012-10-18: qty 50

## 2012-10-18 MED ORDER — DIPHENHYDRAMINE HCL 50 MG/ML IJ SOLN: 25.0000 mg | INTRAMUSCULAR | Status: DC | PRN

## 2012-10-18 MED ORDER — TETANUS-DIPHTH-ACELL PERTUSSIS 5-2.5-18.5 LF-MCG/0.5 IM SUSP: 0.5000 mL | Freq: Once | INTRAMUSCULAR | Status: DC

## 2012-10-18 MED ORDER — SODIUM BICARBONATE 8.4 % IV SOLN
INTRAVENOUS | Status: AC
  Filled 2012-10-18: qty 50

## 2012-10-18 SURGICAL SUPPLY — 29 items
CLOTH BEACON ORANGE TIMEOUT ST (SAFETY) ×2 IMPLANT
DERMABOND ADVANCED (GAUZE/BANDAGES/DRESSINGS)
DERMABOND ADVANCED .7 DNX12 (GAUZE/BANDAGES/DRESSINGS) IMPLANT
DRAPE LG THREE QUARTER DISP (DRAPES) ×2 IMPLANT
DRSG OPSITE POSTOP 4X10 (GAUZE/BANDAGES/DRESSINGS) ×2 IMPLANT
DURAPREP 26ML APPLICATOR (WOUND CARE) ×2 IMPLANT
ELECT REM PT RETURN 9FT ADLT (ELECTROSURGICAL) ×2
ELECTRODE REM PT RTRN 9FT ADLT (ELECTROSURGICAL) ×1 IMPLANT
EXTRACTOR VACUUM M CUP 4 TUBE (SUCTIONS) IMPLANT
GLOVE BIO SURGEON STRL SZ 6 (GLOVE) ×2 IMPLANT
GLOVE BIOGEL PI IND STRL 6 (GLOVE) ×2 IMPLANT
GLOVE BIOGEL PI INDICATOR 6 (GLOVE) ×2
GOWN STRL REIN XL XLG (GOWN DISPOSABLE) ×4 IMPLANT
KIT ABG SYR 3ML LUER SLIP (SYRINGE) ×2 IMPLANT
NEEDLE HYPO 25X5/8 SAFETYGLIDE (NEEDLE) ×2 IMPLANT
NS IRRIG 1000ML POUR BTL (IV SOLUTION) ×2 IMPLANT
PACK C SECTION WH (CUSTOM PROCEDURE TRAY) ×2 IMPLANT
PAD OB MATERNITY 4.3X12.25 (PERSONAL CARE ITEMS) ×2 IMPLANT
SLEEVE SCD COMPRESS KNEE MED (MISCELLANEOUS) ×2 IMPLANT
STAPLER VISISTAT 35W (STAPLE) ×2 IMPLANT
SUT CHROMIC 0 CTX 36 (SUTURE) ×8 IMPLANT
SUT MON AB 2-0 CT1 27 (SUTURE) ×2 IMPLANT
SUT PDS AB 0 CT1 27 (SUTURE) IMPLANT
SUT PLAIN 0 NONE (SUTURE) IMPLANT
SUT VIC AB 0 CT1 36 (SUTURE) ×4 IMPLANT
SUT VIC AB 4-0 KS 27 (SUTURE) IMPLANT
TOWEL OR 17X24 6PK STRL BLUE (TOWEL DISPOSABLE) ×6 IMPLANT
TRAY FOLEY CATH 14FR (SET/KITS/TRAYS/PACK) ×2 IMPLANT
WATER STERILE IRR 1000ML POUR (IV SOLUTION) ×2 IMPLANT

## 2012-10-18 NOTE — Progress Notes (Signed)
Patient has been pushing off and on for three hours.  The head is at +3 station however the patient, due to exhaustion, cannot continue to push.  She is offered operative vaginal delivery but declines requesting C/S.  She is informed of the risk of bleeding possibly requiring blood transfusion, life-saving C-hyst, damage to surrounding structures including the fetus, infection and effects in future pregnancies.  All questions were answered and she desires to proceed.

## 2012-10-18 NOTE — OR Nursing (Signed)
100 ml blood loss during fundal massage by A. Clearance Coots RN, cord blood x 2 to OR front desk

## 2012-10-18 NOTE — Op Note (Signed)
Katie Deleon PROCEDURE DATE: 10/16/2012 - 10/18/2012  PREOPERATIVE DIAGNOSIS: Intrauterine pregnancy at  [redacted]w[redacted]d weeks gestation with arrest of descent and chorioamnionitis  POSTOPERATIVE DIAGNOSIS: The same  PROCEDURE:    Low Transverse Cesarean Section  SURGEON:  Dr. Mitchel Honour  INDICATIONS: Katie Deleon is a 32 y.o. G2P0010 at [redacted]w[redacted]d scheduled for cesarean section secondary arrest of descent.  The risks of cesarean section discussed with the patient included but were not limited to: bleeding which may require transfusion or reoperation; infection which may require antibiotics; injury to bowel, bladder, ureters or other surrounding organs; injury to the fetus; need for additional procedures including hysterectomy in the event of a life-threatening hemorrhage; placental abnormalities wth subsequent pregnancies, incisional problems, thromboembolic phenomenon and other postoperative/anesthesia complications. The patient concurred with the proposed plan, giving informed written consent for the procedure.    FINDINGS:  Viable female infant in cephalic presentation, APGARs 9,9:  Weight pending clear amniotic fluid.  Intact placenta, three vessel cord.  Grossly normal uterus, ovaries and fallopian tubes. .   ANESTHESIA:    Epidural ESTIMATED BLOOD LOSS: 600 ml SPECIMENS: Placenta sent to pathology COMPLICATIONS: None immediate  PROCEDURE IN DETAIL:  The patient received intravenous antibiotics and had sequential compression devices applied to her lower extremities while in the preoperative area.  She was then taken to the operating room where epidural anesthesia was dosed up to surgical level and was found to be adequate. She was then placed in a dorsal supine position with a leftward tilt, and prepped and draped in a sterile manner.  A foley catheter was placed into her bladder and attached to constant gravity.  After an adequate timeout was performed, a Pfannenstiel skin incision was made with  scalpel and carried through to the underlying layer of fascia. The fascia was incised in the midline and this incision was extended bilaterally using the Mayo scissors. Kocher clamps were applied to the superior aspect of the fascial incision and the underlying rectus muscles were dissected off bluntly. A similar process was carried out on the inferior aspect of the facial incision. The rectus muscles were separated in the midline bluntly and the peritoneum was entered bluntly.   A transverse hysterotomy was made with a scalpel and extended bilaterally bluntly. The bladder blade was then removed. The infant was successfully delivered, and cord was clamped and cut and infant was handed over to awaiting neonatology team. Uterine massage was then administered and the placenta delivered intact with three-vessel cord. The uterus was cleared of clot and debris.  The hysterotomy was closed with #1 Chromic.  A second imbricating suture of #1 Chromic was used to reinforce the incision and aid in hemostasis.  The peritoneum and rectus muscles were noted to be hemostatic.  The fascia was closed with 0-Vicryl in a running fashion with good restoration of anatomy.  The subcutaneus tissue was copiously irrigated.  The skin was closed with staples.  Pt tolerated the procedure will.  All counts were correct x2.  Pt went to the recovery room in stable condition.

## 2012-10-18 NOTE — Transfer of Care (Signed)
Immediate Anesthesia Transfer of Care Note  Patient: Katie Deleon  Procedure(s) Performed: Procedure(s): Primary CESAREAN SECTION of baby girl at 40  APGAR 9/9 (N/A)  Patient Location: PACU  Anesthesia Type:Epidural  Level of Consciousness: awake, alert  and oriented  Airway & Oxygen Therapy: Patient Spontanous Breathing  Post-op Assessment: Report given to PACU RN and Post -op Vital signs reviewed and stable  Post vital signs: Reviewed and stable  Complications: No apparent anesthesia complications

## 2012-10-18 NOTE — Anesthesia Postprocedure Evaluation (Signed)
  Anesthesia Post-op Note  Patient: Katie Deleon  Procedure(s) Performed: Procedure(s): Primary CESAREAN SECTION of baby girl at 34  APGAR 9/9 (N/A)  Patient Location: Mother/Baby  Anesthesia Type:Epidural  Level of Consciousness: awake, alert  and oriented  Airway and Oxygen Therapy: Patient Spontanous Breathing  Post-op Pain: none  Post-op Assessment: Post-op Vital signs reviewed and Patient's Cardiovascular Status Stable  Post-op Vital Signs: Reviewed and stable  Complications: No apparent anesthesia complications

## 2012-10-18 NOTE — Progress Notes (Signed)
Subjective: Postpartum Day 0: Cesarean Delivery Patient reports tolerating PO.    Objective: Vital signs in last 24 hours: Temp:  [97.9 F (36.6 C)-101.1 F (38.4 C)] 98.7 F (37.1 C) (03/18 0800) Pulse Rate:  [71-108] 83 (03/18 0800) Resp:  [18-26] 18 (03/18 0800) BP: (97-154)/(55-88) 125/75 mmHg (03/18 0800) SpO2:  [95 %-97 %] 95 % (03/18 0800)  Physical Exam:  General: alert and cooperative Lochia: appropriate Uterine Fundus: firm Incision: honeycomb dressing CDI DVT Evaluation: No evidence of DVT seen on physical exam. No significant calf/ankle edema. Small labial edema noted  foley with adequate output, clear urine  Recent Labs  10/16/12 1940  HGB 12.7  HCT 37.0    Assessment/Plan: Status post Cesarean section. Doing well postoperatively.  Continue current care.  Valynn Schamberger G 10/18/2012, 8:20 AM

## 2012-10-18 NOTE — Anesthesia Postprocedure Evaluation (Signed)
Anesthesia Post Note  Patient: Katie Deleon  Procedure(s) Performed: Procedure(s) (LRB): Primary CESAREAN SECTION of baby girl at 72  APGAR 9/9 (N/A)  Anesthesia type: Epidural  Patient location: PACU  Post pain: Pain level controlled  Post assessment: Post-op Vital signs reviewed  Last Vitals:  Filed Vitals:   10/18/12 0645  BP: 120/70  Pulse: 97  Temp:   Resp: 19    Post vital signs: Reviewed  Level of consciousness: awake  Complications: No apparent anesthesia complications

## 2012-10-19 LAB — CBC
HCT: 32 % — ABNORMAL LOW (ref 36.0–46.0)
Hemoglobin: 11 g/dL — ABNORMAL LOW (ref 12.0–15.0)
MCHC: 34.4 g/dL (ref 30.0–36.0)
RBC: 3.45 MIL/uL — ABNORMAL LOW (ref 3.87–5.11)
WBC: 14.5 10*3/uL — ABNORMAL HIGH (ref 4.0–10.5)

## 2012-10-19 NOTE — Progress Notes (Signed)
Subjective: Postpartum Day 1: Cesarean Delivery Patient reports tolerating PO, + flatus and no problems voiding.    Objective: Vital signs in last 24 hours: Temp:  [97.2 F (36.2 C)-99.1 F (37.3 C)] 98.3 F (36.8 C) (03/19 0524) Pulse Rate:  [75-97] 84 (03/19 0524) Resp:  [18-20] 18 (03/19 0524) BP: (97-130)/(60-77) 111/65 mmHg (03/19 0524) SpO2:  [94 %-95 %] 95 % (03/19 0524)  Physical Exam:  General: alert and cooperative Lochia: appropriate Uterine Fundus: firm Incision: honeycomb dressing noted with small drainage noted on R margin of bandage. DVT Evaluation: No evidence of DVT seen on physical exam. Negative Homan's sign. No cords or calf tenderness. Calf/Ankle edema is present.   Recent Labs  10/16/12 1940 10/19/12 0620  HGB 12.7 11.0*  HCT 37.0 32.0*    Assessment/Plan: Status post Cesarean section. Doing well postoperatively.  Continue current care.  Christinna Sprung G 10/19/2012, 8:04 AM

## 2012-10-19 NOTE — Anesthesia Postprocedure Evaluation (Signed)
Anesthesia Post Note  Patient: Katie Deleon  Procedure(s) Performed: Procedure(s) (LRB): Primary CESAREAN SECTION of baby girl at 54  APGAR 9/9 (N/A)  Anesthesia type: Epidural  Patient location: Mother/Baby  Post pain: Pain level controlled  Post assessment: Post-op Vital signs reviewed  Last Vitals:  Filed Vitals:   10/19/12 0524  BP: 111/65  Pulse: 84  Temp: 36.8 C  Resp: 18    Post vital signs: Reviewed  Level of consciousness:alert  Complications: No apparent anesthesia complications

## 2012-10-20 ENCOUNTER — Encounter (HOSPITAL_COMMUNITY): Payer: Self-pay | Admitting: Obstetrics & Gynecology

## 2012-10-20 MED ORDER — OXYCODONE-ACETAMINOPHEN 7.5-325 MG PO TABS: 1.0000 | ORAL_TABLET | ORAL | Status: DC | PRN

## 2012-10-20 NOTE — Discharge Summary (Signed)
Obstetric Discharge Summary Reason for Admission: induction of labor Prenatal Procedures: none Intrapartum Procedures: cesarean: low cervical, transverse Postpartum Procedures: none and Rho(D) Ig Complications-Operative and Postpartum: none Hemoglobin  Date Value Range Status  10/19/2012 11.0* 12.0 - 15.0 g/dL Final     HCT  Date Value Range Status  10/19/2012 32.0* 36.0 - 46.0 % Final    Physical Exam:  General: alert Lochia: appropriate Uterine Fundus: firm Incision: healing well DVT Evaluation: No evidence of DVT seen on physical exam.  Discharge Diagnoses: Term Pregnancy-delivered  Discharge Information: Date: 10/20/2012 Activity: pelvic rest Diet: routine Medications: PNV, Ibuprofen and Percocet Condition: stable Instructions: refer to practice specific booklet Discharge to: home   Newborn Data: Live born female  Birth Weight: 7 lb 15.7 oz (3620 g) APGAR: 9, 9  Home with mother.  Waris Rodger S 10/20/2012, 9:06 AM

## 2012-10-20 NOTE — Plan of Care (Signed)
Problem: Discharge Progression Outcomes Goal: Remove staples per MD order Outcome: Not Applicable Date Met:  10/20/12 Will remove at office

## 2012-10-20 NOTE — Progress Notes (Signed)
Subjective: Postpartum Day 2: Cesarean Delivery Patient reports tolerating PO, + flatus and no problems voiding.    Objective: Vital signs in last 24 hours: Temp:  [98.5 F (36.9 C)-98.9 F (37.2 C)] 98.9 F (37.2 C) (03/20 0529) Pulse Rate:  [65-75] 65 (03/20 0529) Resp:  [18] 18 (03/20 0529) BP: (108-114)/(63-72) 108/72 mmHg (03/20 0529)  Physical Exam:  General: alert and cooperative Lochia: appropriate Uterine Fundus: firm Incision: honeycomb dressing noted with old drainage on bandage, staples appear to be intact DVT Evaluation: No evidence of DVT seen on physical exam. Negative Homan's sign. No cords or calf tenderness. No significant calf/ankle edema.   Recent Labs  10/19/12 0620  HGB 11.0*  HCT 32.0*    Assessment/Plan: Status post Cesarean section. Doing well postoperatively.  Continue current care.  Katie Deleon G 10/20/2012, 7:58 AM

## 2013-08-15 IMAGING — US US RENAL
1 series · 14 of 25 positions shown · non-contrast
Comparison: None.

CLINICAL DATA: 26 weeks pregnant with right flank pain.

RENAL/URINARY TRACT ULTRASOUND COMPLETE

[Series 1: us renal · 14 of 42 slices shown]
[im 1/42]
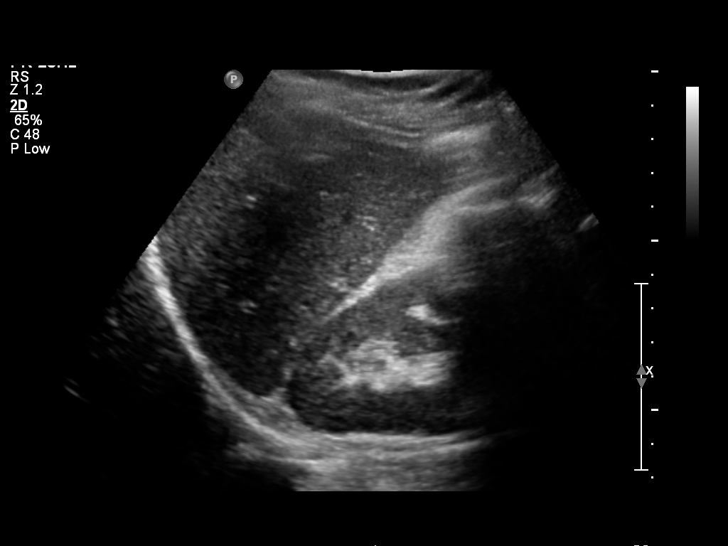
[im 4/42]
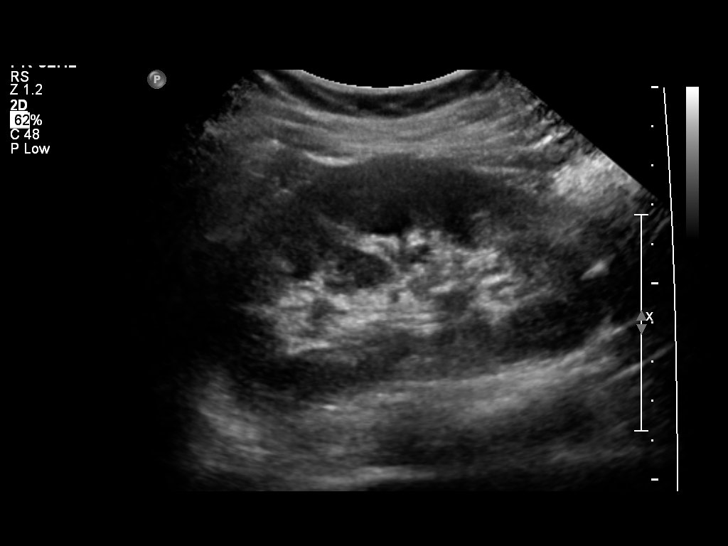
[im 7/42]
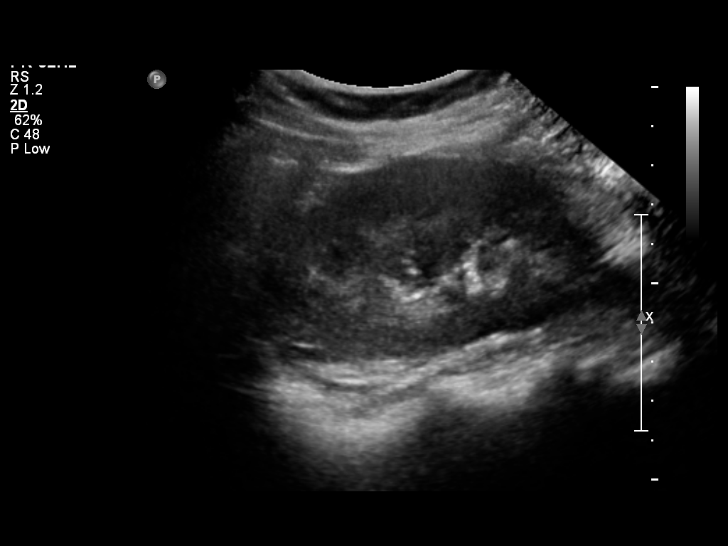
[im 11/42]
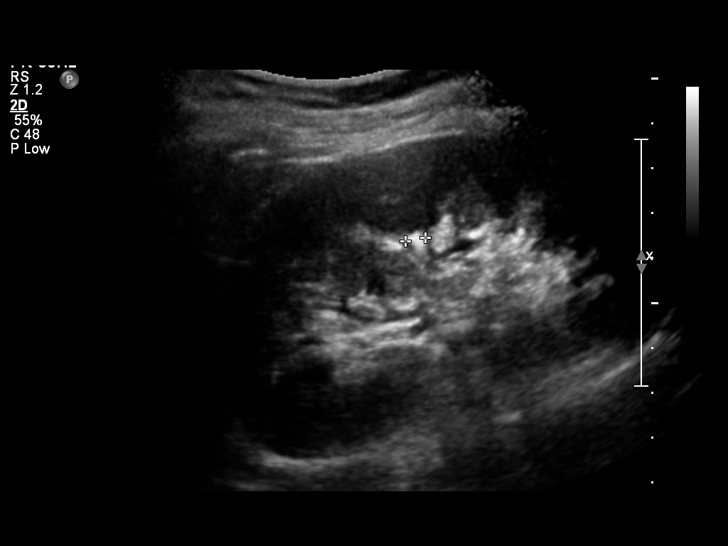
[im 14/42]
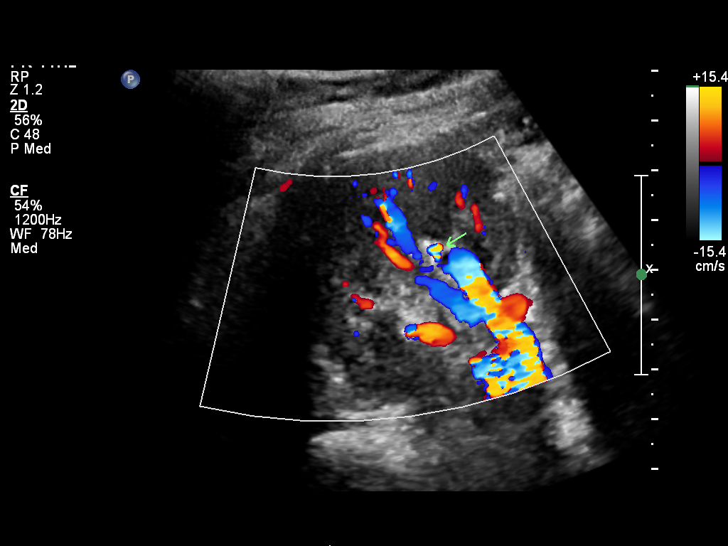
[im 16/42]
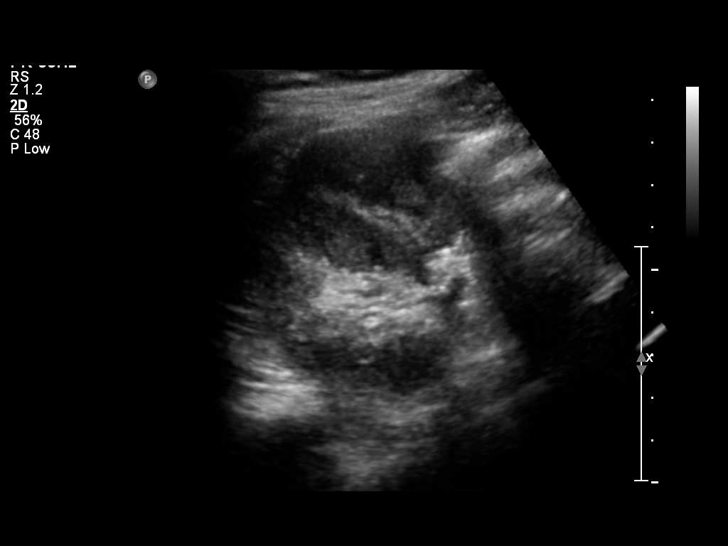
[im 19/42]
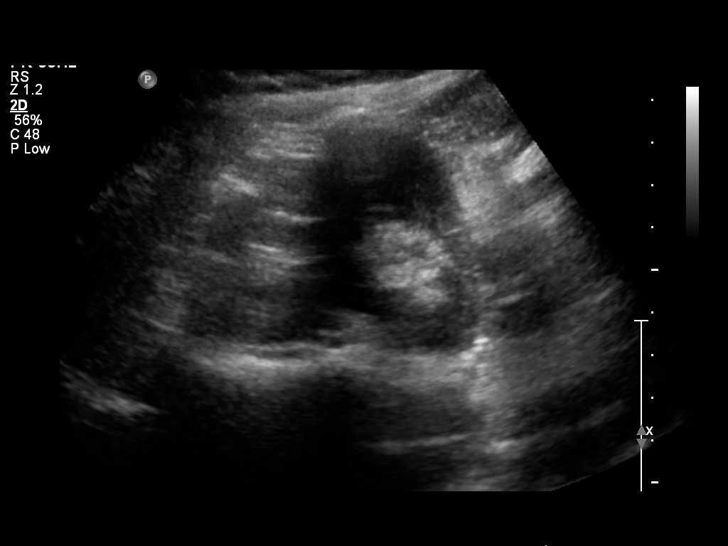
[im 23/42]
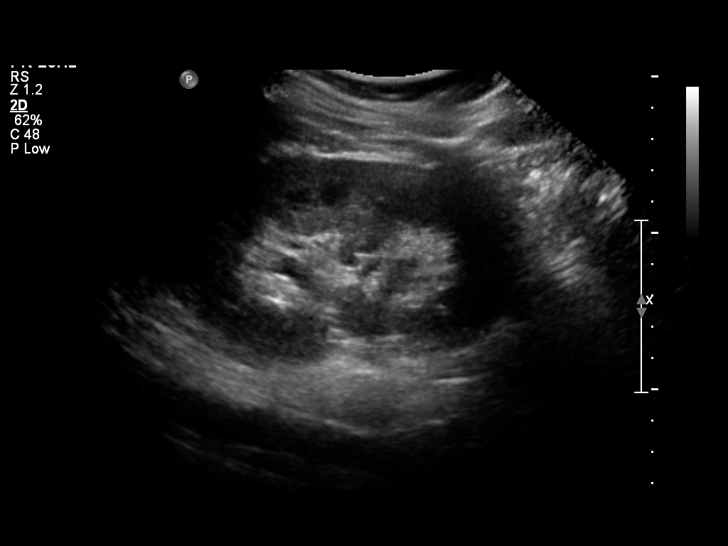
[im 26/42]
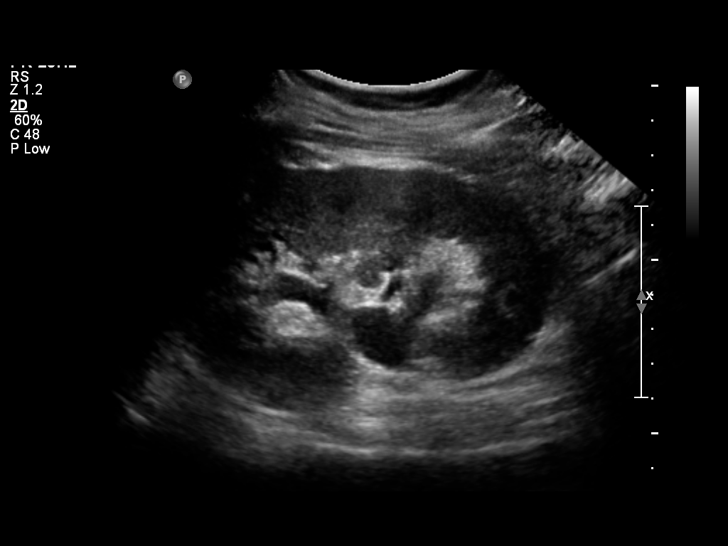
[im 28/42]
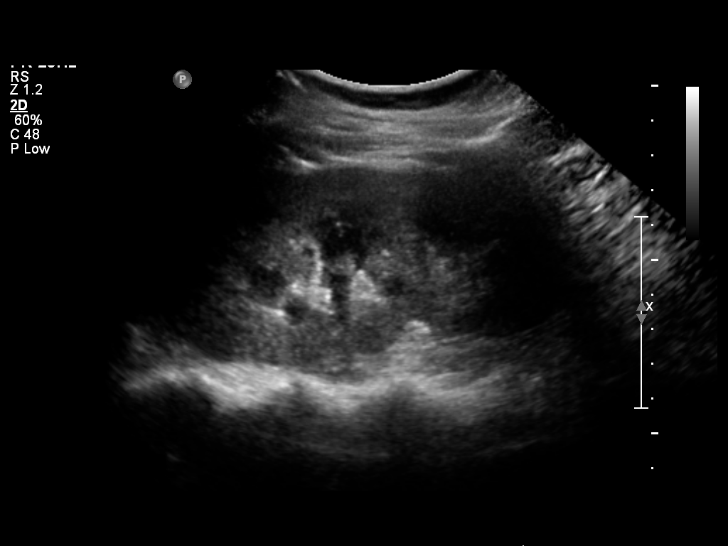
[im 31/42]
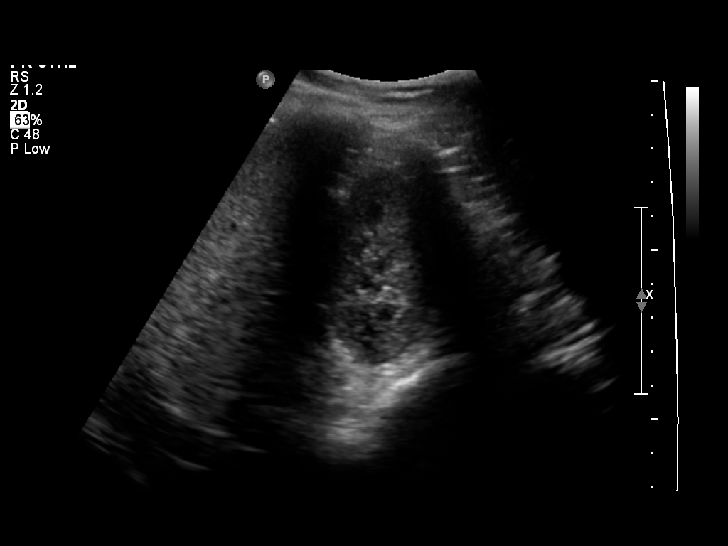
[im 35/42]
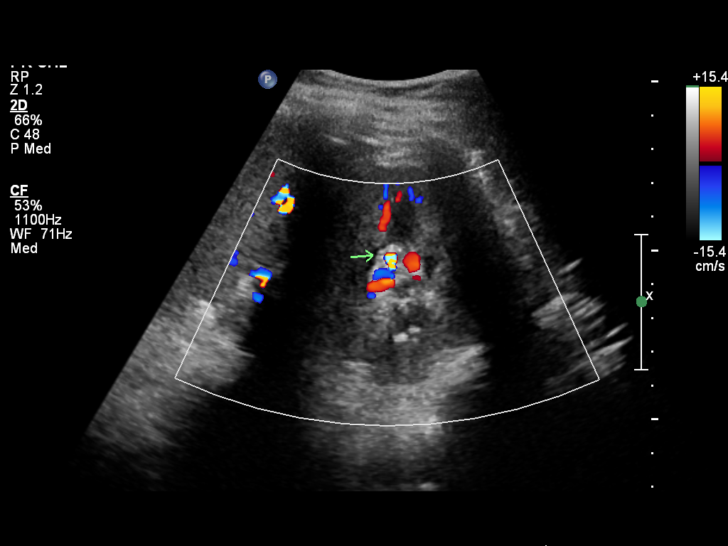
[im 38/42]
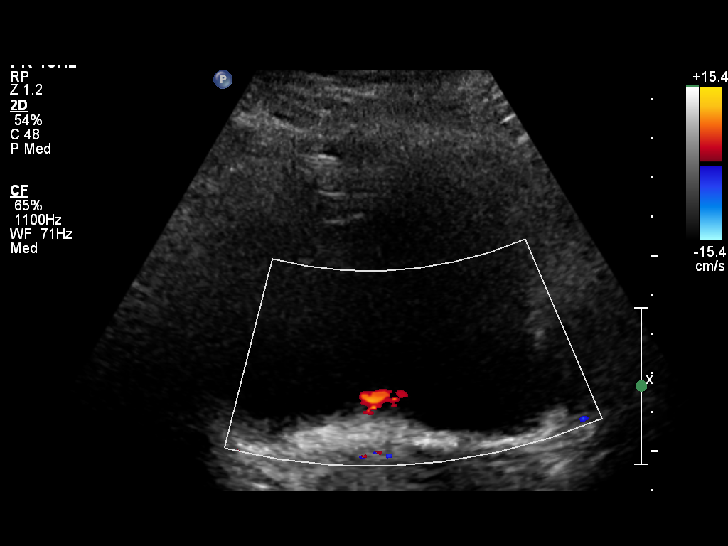
[im 42/42]
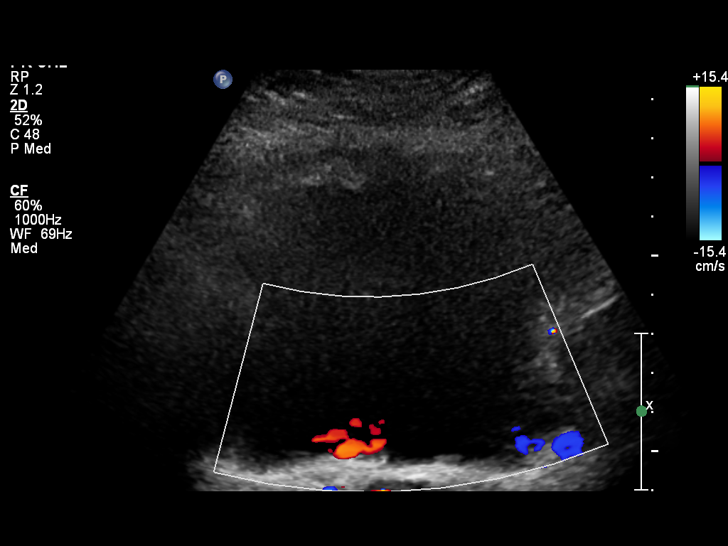

[14 of 25 positions shown; findings below may reference images not displayed]

FINDINGS: Right Kidney:  10.4 cm in length.  A 4.5 mm intra pole calculus is
noted with associated acoustic shadowing.  No hydronephrosis is
demonstrated.  No perinephric fluid collections.  The renal
parenchyma appears normal.  A right ureteral jet is documented.

Left Kidney:  Small renal calculus suspected.  Mild hydronephrosis
is present.  No left-sided ureteral jet is visualized.

Bladder:  Normal.
IMPRESSION: 1.  Small bilateral renal calculi.
2.  Mild left-sided hydronephrosis.
3.  A right ureteral jet is noted.  No left-sided ureteral jet is
documented.

## 2013-08-28 IMAGING — US US RENAL
1 series · 14 of 25 positions shown · non-contrast
Comparison: None

CLINICAL DATA: Passed kidney stone, follow-up, 28 weeks pregnant.

RENAL/URINARY TRACT ULTRASOUND COMPLETE

[Series 1: us renal · 14 of 36 slices shown]
[im 1/36]
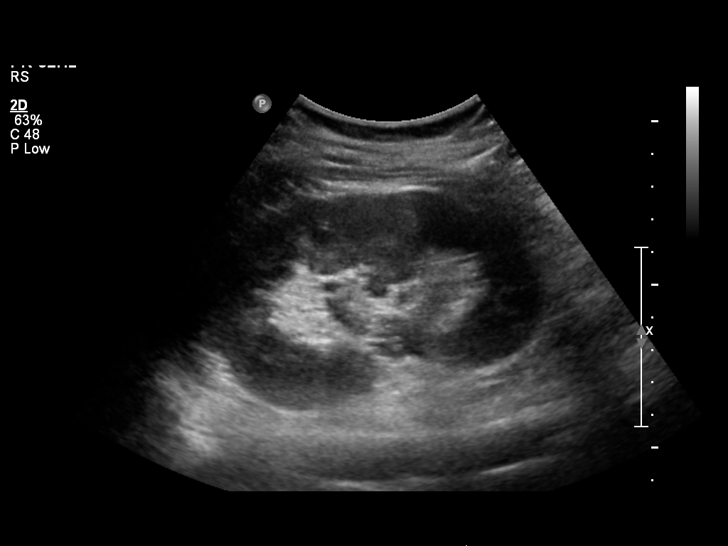
[im 3/36]
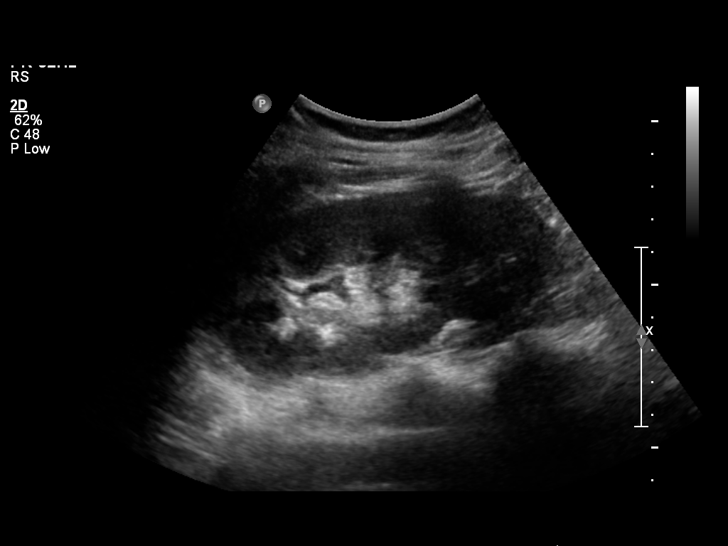
[im 6/36]
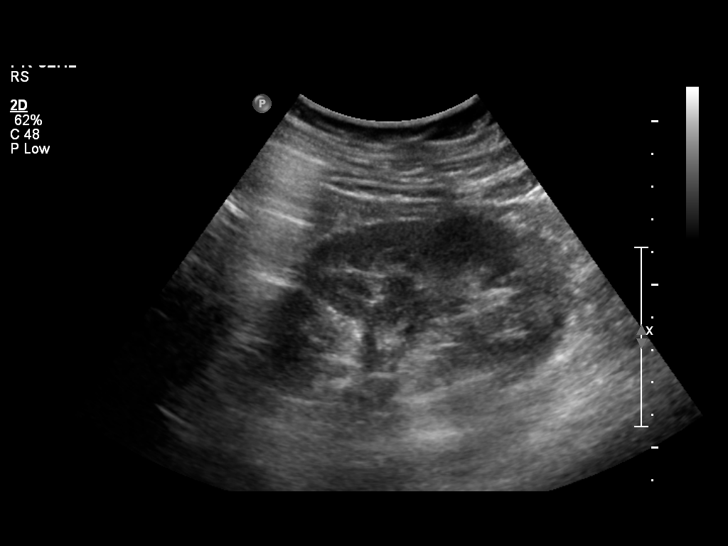
[im 9/36]
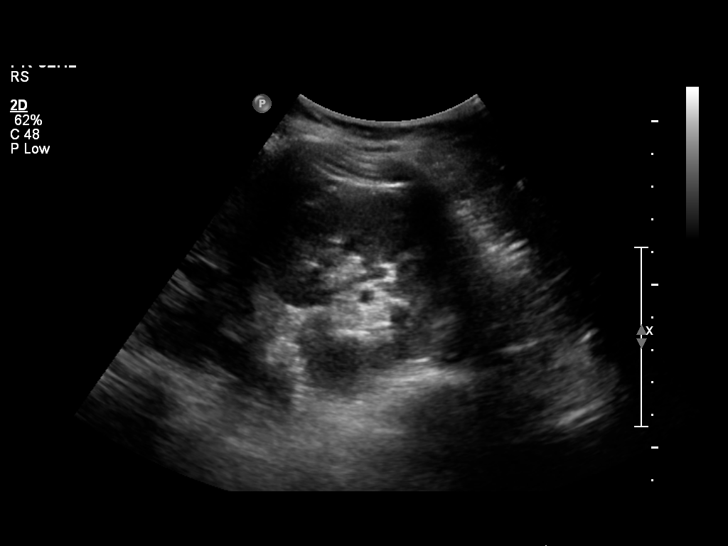
[im 12/36]
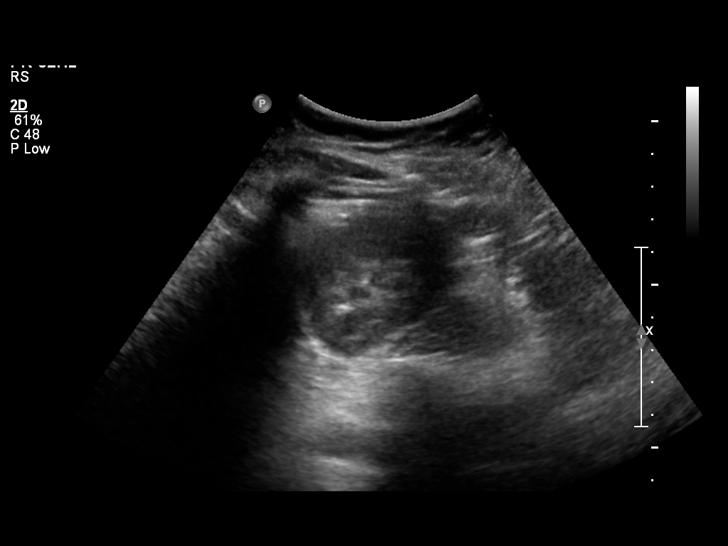
[im 14/36]
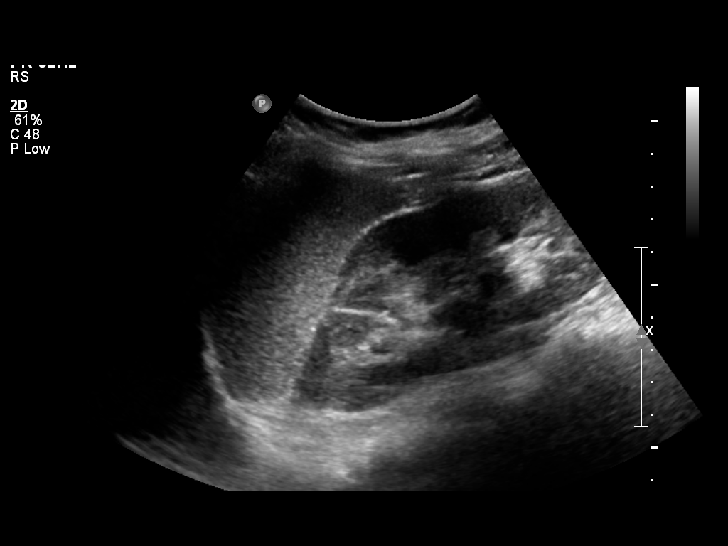
[im 17/36]
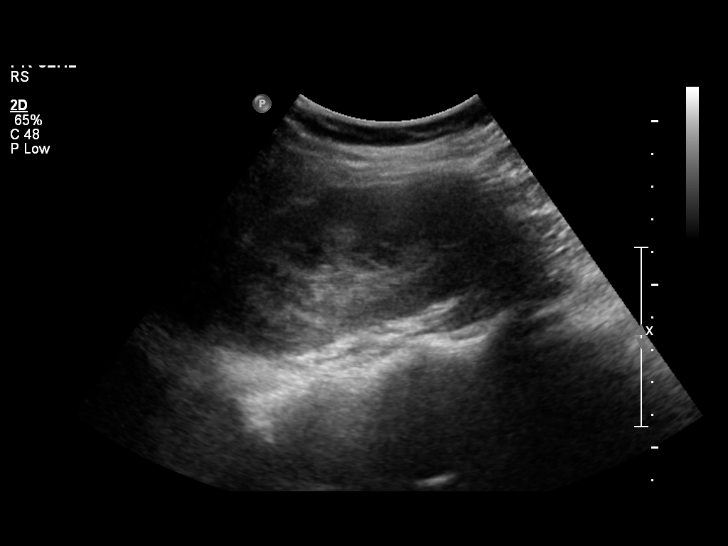
[im 19/36]
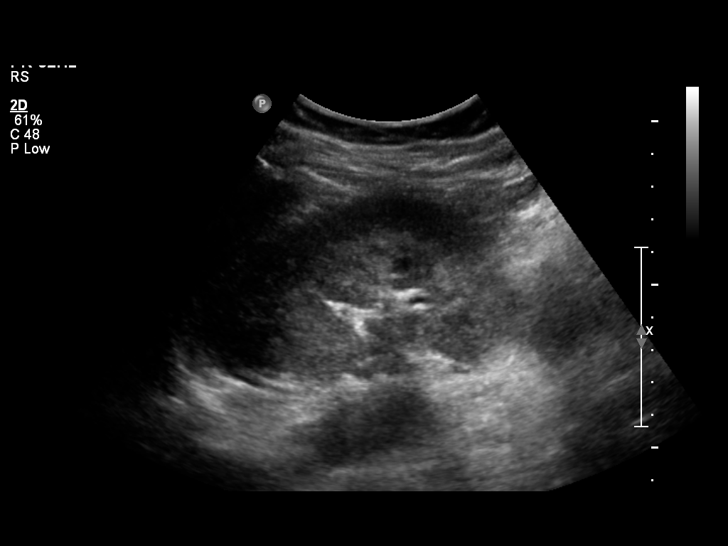
[im 22/36]
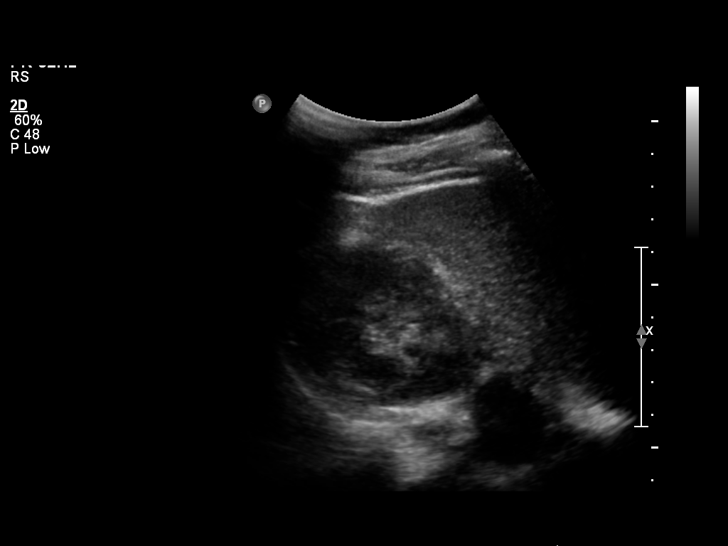
[im 24/36]
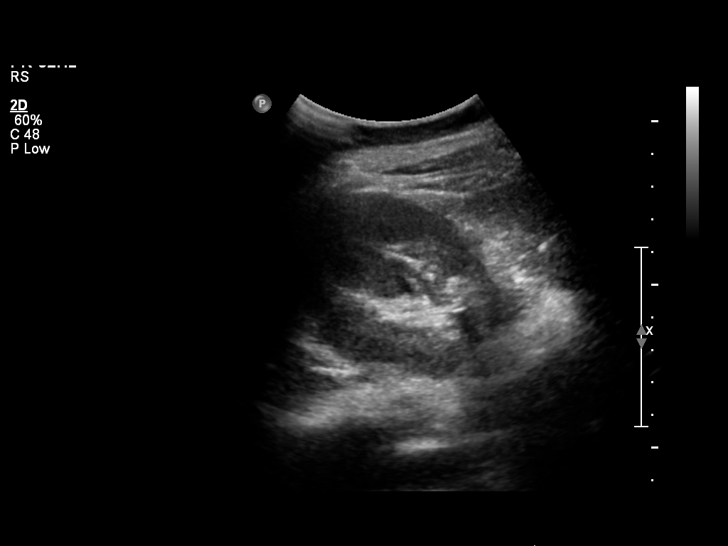
[im 27/36]
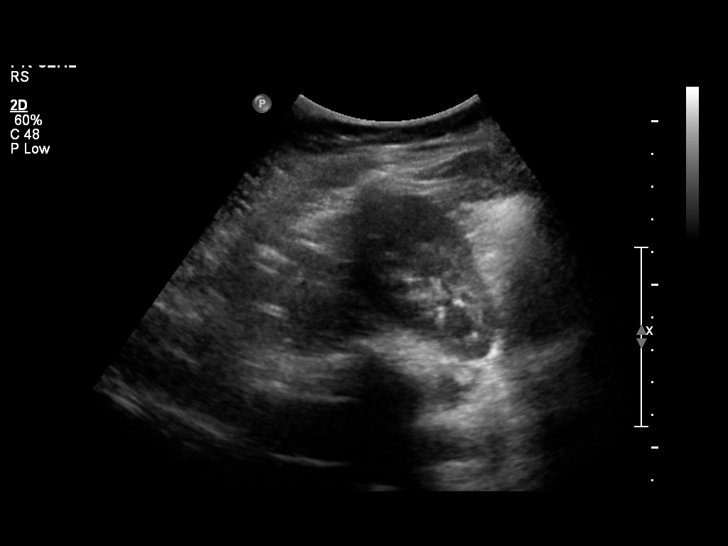
[im 30/36]
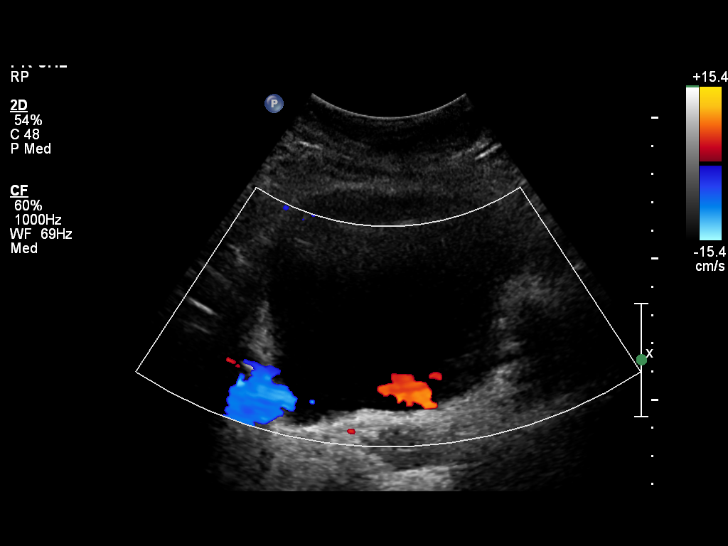
[im 33/36]
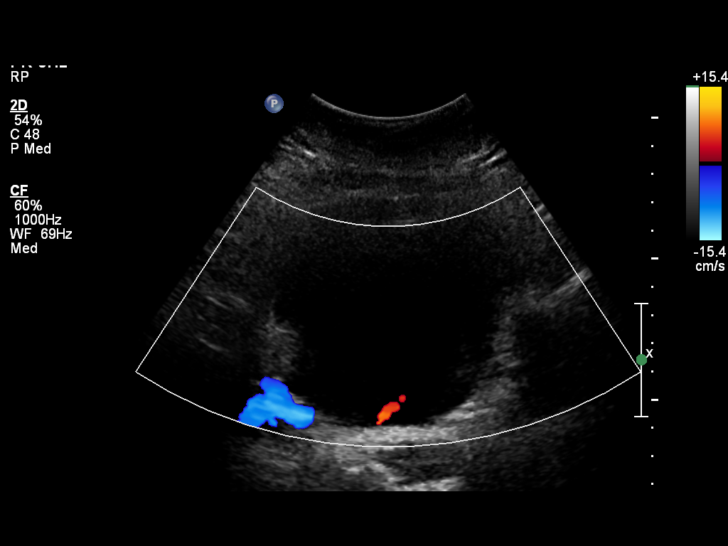
[im 36/36]
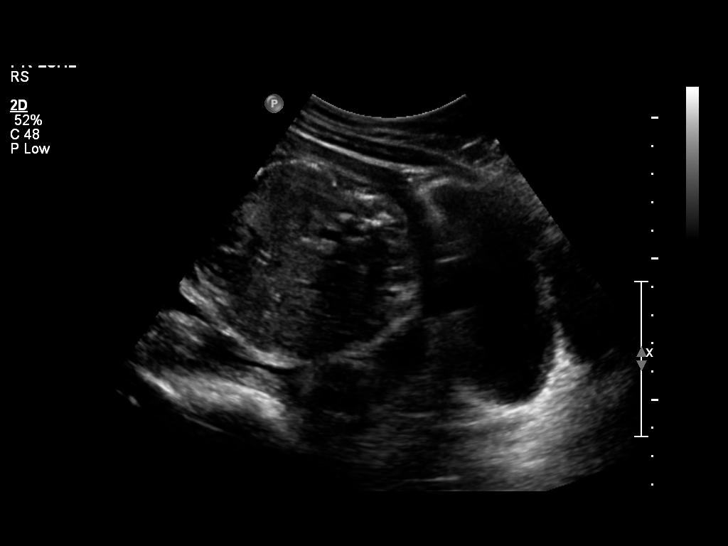

[14 of 25 positions shown; findings below may reference images not displayed]

FINDINGS: Right Kidney:  10.4 cm.  Minimal fullness of the intrarenal
collecting system is noted  which is compatible with the patient's
gravid state.  No solid mass or echogenic shadowing calculus
identified.

Left Kidney:  11.1 cm.  Mild fullness of the intrarenal collecting
system without hydronephrosis.  No echogenic shadowing calculus or
mass is identified.

Bladder:  Normal.  Bilateral ureteral jets are visualized.
IMPRESSION: Minimal fullness of the bilateral renal collecting systems but no
overt hydronephrosis or sonographic evidence for calculus
identified.

## 2014-06-04 ENCOUNTER — Encounter (HOSPITAL_COMMUNITY): Payer: Self-pay | Admitting: Obstetrics & Gynecology

## 2015-01-09 ENCOUNTER — Other Ambulatory Visit: Payer: Self-pay | Admitting: Obstetrics and Gynecology

## 2015-01-11 LAB — CYTOLOGY - PAP

## 2016-06-03 LAB — OB RESULTS CONSOLE ANTIBODY SCREEN: Antibody Screen: NEGATIVE

## 2016-06-03 LAB — OB RESULTS CONSOLE ABO/RH: RH Type: POSITIVE

## 2016-06-03 LAB — OB RESULTS CONSOLE GC/CHLAMYDIA
Chlamydia: NEGATIVE
GC PROBE AMP, GENITAL: NEGATIVE

## 2016-06-03 LAB — OB RESULTS CONSOLE RUBELLA ANTIBODY, IGM: Rubella: IMMUNE

## 2016-06-03 LAB — OB RESULTS CONSOLE RPR: RPR: NONREACTIVE

## 2016-06-03 LAB — OB RESULTS CONSOLE HIV ANTIBODY (ROUTINE TESTING): HIV: NONREACTIVE

## 2016-06-03 LAB — OB RESULTS CONSOLE HEPATITIS B SURFACE ANTIGEN: Hepatitis B Surface Ag: NEGATIVE

## 2016-12-18 NOTE — H&P (Addendum)
Katie Deleon is a 36 y.o. female presenting for repeat c-section @ 39 wks.    OB History    Gravida Para Term Preterm AB Living   2 1 1  0 1 0   SAB TAB Ectopic Multiple Live Births   0 1 0 0       Past Medical History:  Diagnosis Date  . No pertinent past medical history    Past Surgical History:  Procedure Laterality Date  . CESAREAN SECTION N/A 10/18/2012   Procedure: Primary CESAREAN SECTION of baby girl at 0531  APGAR 9/9;  Surgeon: Katie HonourMegan Morris, DO;  Location: [facility];  Service: Obstetrics;  Laterality: N/A;  . NO PAST SURGERIES     Family History: family history is not on file. Social History:  reports that she has never smoked. She does not have any smokeless tobacco history on file. She reports that she does not drink alcohol or use drugs.     Maternal Diabetes: No Genetic Screening: Declined Maternal Ultrasounds/Referrals: Normal Fetal Ultrasounds or other Referrals:  Other: renal pyelectasis Maternal Substance Abuse:  No Significant Maternal Medications:  None Significant Maternal Lab Results:  None Other Comments:  None  ROS History   Exam  AF, VSS Physical Exam  Gen - NAD CV - RRR Lungs - clear Abd - gravid, NT Ext - NT Cvx - closed Prenatal labs: ABO, Rh:   Antibody:   Rubella:   RPR:    HBsAg:    HIV:    GBS:   neg  Assessment/Plan: Repeat c-section  R/b/a discussed, questions answered, informed consent  Katie Deleon 12/18/2016, 2:31 PM

## 2016-12-22 ENCOUNTER — Telehealth (HOSPITAL_COMMUNITY): Payer: Self-pay | Admitting: *Deleted

## 2016-12-22 ENCOUNTER — Encounter (HOSPITAL_COMMUNITY): Payer: Self-pay | Admitting: *Deleted

## 2016-12-22 LAB — OB RESULTS CONSOLE GBS: STREP GROUP B AG: NEGATIVE

## 2016-12-22 NOTE — Telephone Encounter (Signed)
Preadmission screen  

## 2016-12-23 ENCOUNTER — Telehealth (HOSPITAL_COMMUNITY): Payer: Self-pay | Admitting: *Deleted

## 2016-12-23 NOTE — Telephone Encounter (Signed)
Preadmission screen  

## 2016-12-24 ENCOUNTER — Encounter (HOSPITAL_COMMUNITY): Payer: Self-pay

## 2016-12-25 ENCOUNTER — Encounter (HOSPITAL_COMMUNITY)
Admission: RE | Admit: 2016-12-25 | Discharge: 2016-12-25 | Disposition: A | Discharge status: Home / Self Care | Payer: BC Managed Care – PPO | Source: Ambulatory Visit | Attending: Obstetrics and Gynecology | Admitting: Obstetrics and Gynecology

## 2016-12-25 HISTORY — DX: Personal history of urinary calculi: Z87.442

## 2016-12-25 NOTE — Patient Instructions (Signed)
20 [name]  12/25/2016   Your procedure is scheduled on:  12/30/2015  [facility] through the [facility] Entrance of [facility] at 0530 AM.  Pick up the phone at the desk and dial [phone].   Call this number if you have problems the morning of surgery: [phone]   Remember:   Do not eat food:After Midnight.  Do not drink clear liquids: After Midnight.  Take these medicines the morning of surgery with A SIP OF WATER: none   Do not wear jewelry, make-up or nail polish.  Do not wear lotions, powders, or perfumes. Do not wear deodorant.  Do not shave 48 hours prior to surgery.  Do not bring valuables to the hospital.  [facility] is not   responsible for any belongings or valuables brought to the hospital.  Contacts, dentures or bridgework may not be worn into surgery.  Leave suitcase in the car. After surgery it may be brought to your room.  For patients admitted to the hospital, checkout time is 11:00 AM the day of              discharge.   Patients discharged the day of surgery will not be allowed to drive             home.  Name and phone number of your driver: na  Special Instructions:   N/A   Please read over the following fact sheets that you were given:   Surgical Site Infection Prevention

## 2016-12-28 ENCOUNTER — Other Ambulatory Visit: Payer: Self-pay | Admitting: Obstetrics and Gynecology

## 2016-12-28 DIAGNOSIS — Z3A39 39 weeks gestation of pregnancy: Secondary | ICD-10-CM

## 2016-12-28 LAB — CBC
HCT: 36.6 % (ref 36.0–46.0)
HEMOGLOBIN: 12.3 g/dL (ref 12.0–15.0)
MCH: 31.1 pg (ref 26.0–34.0)
MCHC: 33.6 g/dL (ref 30.0–36.0)
MCV: 92.4 fL (ref 78.0–100.0)
PLATELETS: 196 10*3/uL (ref 150–400)
RBC: 3.96 MIL/uL (ref 3.87–5.11)
RDW: 13.6 % (ref 11.5–15.5)
WBC: 9 10*3/uL (ref 4.0–10.5)

## 2016-12-28 LAB — TYPE AND SCREEN
ABO/RH(D): AB POS
Antibody Screen: NEGATIVE

## 2016-12-28 NOTE — Anesthesia Preprocedure Evaluation (Signed)
Anesthesia Evaluation  Patient identified by MRN, date of birth, ID band Patient awake    Reviewed: Allergy & Precautions, H&P , NPO status , Patient's Chart, lab work & pertinent test results  Airway Mallampati: II  TM Distance: >3 FB Neck ROM: full    Dental  (+) Teeth Intact   Pulmonary    breath sounds clear to auscultation       Cardiovascular  Rhythm:regular Rate:Normal     Neuro/Psych    GI/Hepatic   Endo/Other    Renal/GU      Musculoskeletal   Abdominal   Peds  Hematology   Anesthesia Other Findings       Reproductive/Obstetrics (+) Pregnancy                             Anesthesia Physical  Anesthesia Plan  ASA: II  Anesthesia Plan: Spinal   Post-op Pain Management:    Induction:   Airway Management Planned:   Additional Equipment:   Intra-op Plan:   Post-operative Plan:   Informed Consent: I have reviewed the patients History and Physical, chart, labs and discussed the procedure including the risks, benefits and alternatives for the proposed anesthesia with the patient or authorized representative who has indicated his/her understanding and acceptance.   Dental Advisory Given  Plan Discussed with: CRNA  Anesthesia Plan Comments:         Anesthesia Quick Evaluation

## 2016-12-29 ENCOUNTER — Encounter (HOSPITAL_COMMUNITY): Payer: Self-pay | Admitting: Anesthesiology

## 2016-12-29 ENCOUNTER — Encounter (HOSPITAL_COMMUNITY)
Admission: RE | Disposition: A | Discharge status: Home / Self Care | Payer: Self-pay | Source: Ambulatory Visit | Attending: Obstetrics and Gynecology

## 2016-12-29 ENCOUNTER — Inpatient Hospital Stay (HOSPITAL_COMMUNITY)
Admission: RE | Admit: 2016-12-29 | Discharge: 2016-12-31 | DRG: 766 | Disposition: A | Discharge status: Home / Self Care | Payer: BC Managed Care – PPO | Source: Ambulatory Visit | Attending: Obstetrics and Gynecology | Admitting: Obstetrics and Gynecology

## 2016-12-29 ENCOUNTER — Inpatient Hospital Stay (HOSPITAL_COMMUNITY): Payer: BC Managed Care – PPO

## 2016-12-29 DIAGNOSIS — O358XX0 Maternal care for other (suspected) fetal abnormality and damage, not applicable or unspecified: Secondary | ICD-10-CM | POA: Diagnosis present

## 2016-12-29 DIAGNOSIS — Z3A39 39 weeks gestation of pregnancy: Secondary | ICD-10-CM | POA: Diagnosis not present

## 2016-12-29 DIAGNOSIS — O34211 Maternal care for low transverse scar from previous cesarean delivery: Principal | ICD-10-CM | POA: Diagnosis present

## 2016-12-29 DIAGNOSIS — Z98891 History of uterine scar from previous surgery: Secondary | ICD-10-CM

## 2016-12-29 LAB — RPR: RPR: NONREACTIVE

## 2016-12-29 SURGERY — Surgical Case
Anesthesia: Spinal | Site: Abdomen | Wound class: Clean Contaminated

## 2016-12-29 MED ORDER — NALBUPHINE HCL 10 MG/ML IJ SOLN: 5.0000 mg | INTRAMUSCULAR | Status: DC | PRN

## 2016-12-29 MED ORDER — SIMETHICONE 80 MG PO CHEW
80.0000 mg | CHEWABLE_TABLET | ORAL | Status: DC
  Administered 2016-12-30 – 2016-12-31 (×2): 80 mg via ORAL
  Filled 2016-12-29 (×2): qty 1

## 2016-12-29 MED ORDER — NALOXONE HCL 0.4 MG/ML IJ SOLN: 0.4000 mg | INTRAMUSCULAR | Status: DC | PRN

## 2016-12-29 MED ORDER — PHENYLEPHRINE 8 MG IN D5W 100 ML (0.08MG/ML) PREMIX OPTIME
INJECTION | INTRAVENOUS | Status: AC
  Filled 2016-12-29: qty 100

## 2016-12-29 MED ORDER — BUPIVACAINE IN DEXTROSE 0.75-8.25 % IT SOLN
INTRATHECAL | Status: DC | PRN
  Administered 2016-12-29: 1.7 mg via INTRATHECAL

## 2016-12-29 MED ORDER — CEFAZOLIN SODIUM-DEXTROSE 2-4 GM/100ML-% IV SOLN
2.0000 g | INTRAVENOUS | Status: AC
  Administered 2016-12-29: 2 g via INTRAVENOUS

## 2016-12-29 MED ORDER — KETOROLAC TROMETHAMINE 30 MG/ML IJ SOLN
30.0000 mg | Freq: Once | INTRAMUSCULAR | Status: DC | PRN
  Administered 2016-12-29: 30 mg via INTRAVENOUS

## 2016-12-29 MED ORDER — WITCH HAZEL-GLYCERIN EX PADS: 1.0000 "application " | MEDICATED_PAD | CUTANEOUS | Status: DC | PRN

## 2016-12-29 MED ORDER — MENTHOL 3 MG MT LOZG: 1.0000 | LOZENGE | OROMUCOSAL | Status: DC | PRN

## 2016-12-29 MED ORDER — ONDANSETRON HCL 4 MG/2ML IJ SOLN
INTRAMUSCULAR | Status: AC
  Filled 2016-12-29: qty 2

## 2016-12-29 MED ORDER — NALBUPHINE HCL 10 MG/ML IJ SOLN: 5.0000 mg | Freq: Once | INTRAMUSCULAR | Status: DC | PRN

## 2016-12-29 MED ORDER — PHENYLEPHRINE HCL 10 MG/ML IJ SOLN
INTRAMUSCULAR | Status: DC | PRN
  Administered 2016-12-29: 60 ug via INTRAVENOUS
  Administered 2016-12-29: 45 ug via INTRAVENOUS

## 2016-12-29 MED ORDER — KETOROLAC TROMETHAMINE 30 MG/ML IJ SOLN
INTRAMUSCULAR | Status: AC
  Filled 2016-12-29: qty 1

## 2016-12-29 MED ORDER — FENTANYL CITRATE (PF) 100 MCG/2ML IJ SOLN
INTRAMUSCULAR | Status: AC
  Filled 2016-12-29: qty 2

## 2016-12-29 MED ORDER — PHENYLEPHRINE 40 MCG/ML (10ML) SYRINGE FOR IV PUSH (FOR BLOOD PRESSURE SUPPORT)
PREFILLED_SYRINGE | INTRAVENOUS | Status: DC | PRN
  Administered 2016-12-29 (×2): 80 ug via INTRAVENOUS

## 2016-12-29 MED ORDER — OXYCODONE-ACETAMINOPHEN 5-325 MG PO TABS: 1.0000 | ORAL_TABLET | ORAL | Status: DC | PRN

## 2016-12-29 MED ORDER — OXYTOCIN 40 UNITS IN LACTATED RINGERS INFUSION - SIMPLE MED: 2.5000 [IU]/h | INTRAVENOUS | Status: AC

## 2016-12-29 MED ORDER — MEPERIDINE HCL 25 MG/ML IJ SOLN: 6.2500 mg | INTRAMUSCULAR | Status: DC | PRN

## 2016-12-29 MED ORDER — OXYTOCIN 10 UNIT/ML IJ SOLN
INTRAVENOUS | Status: DC | PRN
  Administered 2016-12-29: 40 [IU] via INTRAVENOUS

## 2016-12-29 MED ORDER — MEASLES, MUMPS & RUBELLA VAC ~~LOC~~ INJ
0.5000 mL | INJECTION | Freq: Once | SUBCUTANEOUS | Status: DC
  Filled 2016-12-29: qty 0.5

## 2016-12-29 MED ORDER — IBUPROFEN 600 MG PO TABS
600.0000 mg | ORAL_TABLET | Freq: Four times a day (QID) | ORAL | Status: DC
  Administered 2016-12-29 – 2016-12-31 (×8): 600 mg via ORAL
  Filled 2016-12-29 (×8): qty 1

## 2016-12-29 MED ORDER — DEXAMETHASONE SODIUM PHOSPHATE 10 MG/ML IJ SOLN
INTRAMUSCULAR | Status: AC
  Filled 2016-12-29: qty 1

## 2016-12-29 MED ORDER — MORPHINE SULFATE (PF) 0.5 MG/ML IJ SOLN
INTRAMUSCULAR | Status: DC | PRN
  Administered 2016-12-29: 200 ug via EPIDURAL

## 2016-12-29 MED ORDER — DEXAMETHASONE SODIUM PHOSPHATE 10 MG/ML IJ SOLN
INTRAMUSCULAR | Status: DC | PRN
  Administered 2016-12-29: 10 mg via INTRAVENOUS

## 2016-12-29 MED ORDER — SODIUM CHLORIDE 0.9% FLUSH: 3.0000 mL | INTRAVENOUS | Status: DC | PRN

## 2016-12-29 MED ORDER — SCOPOLAMINE 1 MG/3DAYS TD PT72
1.0000 | MEDICATED_PATCH | Freq: Once | TRANSDERMAL | Status: DC
  Filled 2016-12-29: qty 1

## 2016-12-29 MED ORDER — PROMETHAZINE HCL 25 MG/ML IJ SOLN: 6.2500 mg | INTRAMUSCULAR | Status: DC | PRN

## 2016-12-29 MED ORDER — DIPHENHYDRAMINE HCL 50 MG/ML IJ SOLN: 12.5000 mg | INTRAMUSCULAR | Status: DC | PRN

## 2016-12-29 MED ORDER — ONDANSETRON HCL 4 MG/2ML IJ SOLN: 4.0000 mg | Freq: Three times a day (TID) | INTRAMUSCULAR | Status: DC | PRN

## 2016-12-29 MED ORDER — SODIUM CHLORIDE 0.9 % IV SOLN: INTRAVENOUS | Status: DC

## 2016-12-29 MED ORDER — DEXTROSE IN LACTATED RINGERS 5 % IV SOLN
INTRAVENOUS | Status: DC
  Administered 2016-12-29: 15:00:00 via INTRAVENOUS

## 2016-12-29 MED ORDER — PHENYLEPHRINE 40 MCG/ML (10ML) SYRINGE FOR IV PUSH (FOR BLOOD PRESSURE SUPPORT)
PREFILLED_SYRINGE | INTRAVENOUS | Status: AC
  Filled 2016-12-29: qty 10

## 2016-12-29 MED ORDER — DIBUCAINE 1 % RE OINT: 1.0000 "application " | TOPICAL_OINTMENT | RECTAL | Status: DC | PRN

## 2016-12-29 MED ORDER — OXYCODONE-ACETAMINOPHEN 5-325 MG PO TABS: 2.0000 | ORAL_TABLET | ORAL | Status: DC | PRN

## 2016-12-29 MED ORDER — LACTATED RINGERS IV SOLN
INTRAVENOUS | Status: DC
  Administered 2016-12-29: 06:00:00 via INTRAVENOUS

## 2016-12-29 MED ORDER — CEFAZOLIN SODIUM-DEXTROSE 2-4 GM/100ML-% IV SOLN
INTRAVENOUS | Status: AC
  Filled 2016-12-29: qty 100

## 2016-12-29 MED ORDER — OXYTOCIN 10 UNIT/ML IJ SOLN
INTRAMUSCULAR | Status: AC
  Filled 2016-12-29: qty 4

## 2016-12-29 MED ORDER — MORPHINE SULFATE (PF) 0.5 MG/ML IJ SOLN
INTRAMUSCULAR | Status: AC
  Filled 2016-12-29: qty 10

## 2016-12-29 MED ORDER — SIMETHICONE 80 MG PO CHEW
80.0000 mg | CHEWABLE_TABLET | Freq: Three times a day (TID) | ORAL | Status: DC
  Administered 2016-12-29 – 2016-12-31 (×6): 80 mg via ORAL
  Filled 2016-12-29 (×6): qty 1

## 2016-12-29 MED ORDER — TETANUS-DIPHTH-ACELL PERTUSSIS 5-2.5-18.5 LF-MCG/0.5 IM SUSP: 0.5000 mL | Freq: Once | INTRAMUSCULAR | Status: DC

## 2016-12-29 MED ORDER — COCONUT OIL OIL: 1.0000 "application " | TOPICAL_OIL | Status: DC | PRN

## 2016-12-29 MED ORDER — LACTATED RINGERS IV SOLN
INTRAVENOUS | Status: DC | PRN
  Administered 2016-12-29: 08:00:00 via INTRAVENOUS

## 2016-12-29 MED ORDER — SIMETHICONE 80 MG PO CHEW: 80.0000 mg | CHEWABLE_TABLET | ORAL | Status: DC | PRN

## 2016-12-29 MED ORDER — ACETAMINOPHEN 325 MG PO TABS
650.0000 mg | ORAL_TABLET | ORAL | Status: DC | PRN
  Administered 2016-12-29: 650 mg via ORAL
  Filled 2016-12-29: qty 2

## 2016-12-29 MED ORDER — FENTANYL CITRATE (PF) 100 MCG/2ML IJ SOLN: 25.0000 ug | INTRAMUSCULAR | Status: DC | PRN

## 2016-12-29 MED ORDER — KETOROLAC TROMETHAMINE 30 MG/ML IJ SOLN: 30.0000 mg | Freq: Four times a day (QID) | INTRAMUSCULAR | Status: AC | PRN

## 2016-12-29 MED ORDER — ONDANSETRON HCL 4 MG/2ML IJ SOLN
INTRAMUSCULAR | Status: DC | PRN
  Administered 2016-12-29: 4 mg via INTRAVENOUS

## 2016-12-29 MED ORDER — NALOXONE HCL 2 MG/2ML IJ SOSY
1.0000 ug/kg/h | PREFILLED_SYRINGE | INTRAVENOUS | Status: DC | PRN
  Filled 2016-12-29: qty 2

## 2016-12-29 MED ORDER — SOD CITRATE-CITRIC ACID 500-334 MG/5ML PO SOLN
30.0000 mL | Freq: Once | ORAL | Status: AC
  Administered 2016-12-29: 30 mL via ORAL
  Filled 2016-12-29: qty 15

## 2016-12-29 MED ORDER — MEDROXYPROGESTERONE ACETATE 150 MG/ML IM SUSP: 150.0000 mg | INTRAMUSCULAR | Status: DC | PRN

## 2016-12-29 MED ORDER — KETOROLAC TROMETHAMINE 30 MG/ML IJ SOLN
30.0000 mg | Freq: Four times a day (QID) | INTRAMUSCULAR | Status: AC | PRN
Start: 2016-12-29 — End: 2016-12-30

## 2016-12-29 MED ORDER — SENNOSIDES-DOCUSATE SODIUM 8.6-50 MG PO TABS
2.0000 | ORAL_TABLET | ORAL | Status: DC
  Administered 2016-12-30 – 2016-12-31 (×2): 2 via ORAL
  Filled 2016-12-29 (×2): qty 2

## 2016-12-29 MED ORDER — PRENATAL MULTIVITAMIN CH
1.0000 | ORAL_TABLET | Freq: Every day | ORAL | Status: DC
  Administered 2016-12-30: 1 via ORAL
  Filled 2016-12-29: qty 1

## 2016-12-29 MED ORDER — DIPHENHYDRAMINE HCL 25 MG PO CAPS: 25.0000 mg | ORAL_CAPSULE | Freq: Four times a day (QID) | ORAL | Status: DC | PRN

## 2016-12-29 MED ORDER — DIPHENHYDRAMINE HCL 25 MG PO CAPS: 25.0000 mg | ORAL_CAPSULE | ORAL | Status: DC | PRN

## 2016-12-29 SURGICAL SUPPLY — 30 items
CHLORAPREP W/TINT 26ML (MISCELLANEOUS) ×3 IMPLANT
CLAMP CORD UMBIL (MISCELLANEOUS) IMPLANT
CLOTH BEACON ORANGE TIMEOUT ST (SAFETY) ×3 IMPLANT
DERMABOND ADVANCED (GAUZE/BANDAGES/DRESSINGS) ×2
DERMABOND ADVANCED .7 DNX12 (GAUZE/BANDAGES/DRESSINGS) ×1 IMPLANT
DRSG OPSITE POSTOP 4X10 (GAUZE/BANDAGES/DRESSINGS) ×3 IMPLANT
ELECT REM PT RETURN 9FT ADLT (ELECTROSURGICAL) ×3
ELECTRODE REM PT RTRN 9FT ADLT (ELECTROSURGICAL) ×1 IMPLANT
EXTRACTOR VACUUM M CUP 4 TUBE (SUCTIONS) IMPLANT
EXTRACTOR VACUUM M CUP 4' TUBE (SUCTIONS)
GLOVE BIO SURGEON STRL SZ 6.5 (GLOVE) ×2 IMPLANT
GLOVE BIO SURGEONS STRL SZ 6.5 (GLOVE) ×1
GLOVE BIOGEL PI IND STRL 7.0 (GLOVE) ×2 IMPLANT
GLOVE BIOGEL PI INDICATOR 7.0 (GLOVE) ×4
GOWN STRL REUS W/TWL LRG LVL3 (GOWN DISPOSABLE) ×6 IMPLANT
KIT ABG SYR 3ML LUER SLIP (SYRINGE) IMPLANT
NEEDLE HYPO 25X5/8 SAFETYGLIDE (NEEDLE) IMPLANT
NS IRRIG 1000ML POUR BTL (IV SOLUTION) ×3 IMPLANT
PACK C SECTION WH (CUSTOM PROCEDURE TRAY) ×3 IMPLANT
PAD OB MATERNITY 4.3X12.25 (PERSONAL CARE ITEMS) ×3 IMPLANT
PENCIL SMOKE EVAC W/HOLSTER (ELECTROSURGICAL) ×3 IMPLANT
SUT CHROMIC 0 CT 802H (SUTURE) IMPLANT
SUT CHROMIC 0 CTX 36 (SUTURE) ×9 IMPLANT
SUT MON AB-0 CT1 36 (SUTURE) ×3 IMPLANT
SUT PDS AB 0 CTX 60 (SUTURE) ×3 IMPLANT
SUT PLAIN 0 NONE (SUTURE) IMPLANT
SUT VIC AB 4-0 KS 27 (SUTURE) IMPLANT
SYR BULB 3OZ (MISCELLANEOUS) ×3 IMPLANT
TOWEL OR 17X24 6PK STRL BLUE (TOWEL DISPOSABLE) ×3 IMPLANT
TRAY FOLEY BAG SILVER LF 14FR (SET/KITS/TRAYS/PACK) IMPLANT

## 2016-12-29 NOTE — Anesthesia Procedure Notes (Signed)
Spinal  Patient location during procedure: OR Staffing Anesthesiologist: Nolon Nations Performed: anesthesiologist  Preanesthetic Checklist Completed: patient identified, site marked, surgical consent, pre-op evaluation, timeout performed, IV checked, risks and benefits discussed and monitors and equipment checked Spinal Block Patient position: sitting Prep: Betadine Patient monitoring: heart rate, continuous pulse ox and blood pressure Approach: midline Location: L3-4 Injection technique: single-shot Needle Needle type: Sprotte  Needle gauge: 24 G Needle length: 9 cm Additional Notes Expiration date of kit checked and confirmed. Patient tolerated procedure well, without complications.

## 2016-12-29 NOTE — Transfer of Care (Signed)
Immediate Anesthesia Transfer of Care Note  Patient: Katie Deleon  Procedure(s) Performed: Procedure(s) with comments: CESAREAN SECTION (N/A) - Tracey T RNFA  Patient Location: PACU  Anesthesia Type:Spinal  Level of Consciousness: awake, alert  and oriented  Airway & Oxygen Therapy: Patient Spontanous Breathing  Post-op Assessment: Post -op Vital signs reviewed and unstable, Anesthesiologist notified  Post vital signs: Reviewed and stable  Last Vitals:  Vitals:   12/29/16 0607 12/29/16 0824  BP: 109/78 (!) 96/31  Pulse: (!) 115 (!) 104  Resp: 18 16  Temp: 36.8 C     Last Pain:  Vitals:   12/29/16 0607  TempSrc: Oral         Complications: No apparent anesthesia complications

## 2016-12-29 NOTE — Consult Note (Signed)
Neonatology Note:   Attendance at C-section:    I was asked by Dr. Adkins to attend this repeat C/S at term. The mother is a G3P1A1 AB pos, GBS neg with renal pyelectasis on fetal sonogram. ROM at delivery, fluid clear. Infant vigorous with good spontaneous cry and tone. Delayed cord clamping was done. Needed no suctioning. Ap 9/9. Lungs clear to ausc in DR. To CN to care of Pediatrician.   Katie Susman C. Aurthur Wingerter, MD 

## 2016-12-29 NOTE — Op Note (Signed)
Cesarean Section Procedure Note   Katie Deleon  12/29/2016  Indications: Scheduled Proceedure/Maternal Request   Pre-operative Diagnosis: previous X 1 Repeat  edc 01/05/17.   Post-operative Diagnosis: Same   Surgeon: Moishe SpiceSurgeon(s) and Role:    Katie Cairo* Rokhaya Quinn, MD - Primary   Assistants: T. Pricilla Holmucker, RNFA  Anesthesia: spinal   Procedure Details:  The patient was seen in the Holding Room. The risks, benefits, complications, treatment options, and expected outcomes were discussed with the patient. The patient concurred with the proposed plan, giving informed consent. identified as Katie Deleon and the procedure verified as C-Section Delivery. A Time Out was held and the above information confirmed.  After induction of anesthesia, the patient was draped and prepped in the usual sterile manner. A transverse was made and carried down through the subcutaneous tissue to the fascia. Fascial incision was made and extended transversely. The fascia was separated from the underlying rectus tissue superiorly and inferiorly. The peritoneum was identified and entered. Peritoneal incision was extended longitudinally. The utero-vesical peritoneal reflection was incised transversely and the bladder flap was bluntly freed from the lower uterine segment. A low transverse uterine incision was made. Delivered from cephalic presentation was a vigorous female infant.  Cord ph was not sent the umbilical cord was clamped and cut cord blood was obtained for evaluation. The placenta was removed Intact and appeared normal. The uterine outline, tubes and ovaries appeared normal}. The uterine incision was closed with running locked sutures of 0chromic gut.   Hemostasis was observed. Lavage was carried out until clear. Peritoneum was closed with 0 monocryl. The fascia was then reapproximated with running sutures of 0PDS.  The skin was closed with 4-0Vicryl.   Instrument, sponge, and needle counts were correct prior the  abdominal closure and were correct at the conclusion of the case.    Findings:   Estimated Blood Loss:800cc   Urine Output: clear  Specimens: placenta for disposal  Complications: no complications  Disposition: PACU - hemodynamically stable.   Maternal Condition: stable   Baby condition / location:  Couplet care / Skin to Skin  Attending Attestation: I was present and scrubbed for the entire procedure.   Signed: Surgeon(s): Katie Deleon, Katie Liszewski, MD

## 2016-12-29 NOTE — Lactation Note (Signed)
This note was copied from a baby's chart. Lactation Consultation Note  Baby latched upon entering at 6 hours old.  Ex BF 2 years. Reviewed hand expression and drops expressed. Assisted w/ achieving a deeper latch using compression. Mother seems to prefer cradle position.  Demonstrated how to bf in cross cradle. Sucks and swallows observed. Mom encouraged to feed baby 8-12 times/24 hours and with feeding cues.  Mom made aware of O/P services, breastfeeding support groups, community resources, and our phone # for post-discharge questions.  Provided mother w/ manual pump.  Patient Name: [name]'UToday's Date: 12/29/2016 Reason for consult: Initial assessment   Maternal Data Has patient been taught Hand Expression?: Yes Does the patient have breastfeeding experience prior to this delivery?: Yes  Feeding Feeding Type: Breast Fed Length of feed: 25 min  LATCH Score/Interventions Latch: Grasps breast easily, tongue down, lips flanged, rhythmical sucking.  Audible Swallowing: A few with stimulation  Type of Nipple: Everted at rest and after stimulation  Comfort (Breast/Nipple): Soft / non-tender     Hold (Positioning): Assistance needed to correctly position infant at breast and maintain latch.  LATCH Score: 8  Lactation Tools Discussed/Used     Consult Status Consult Status: Follow-up Date: 12/30/16 Follow-up type: In-patient    Dahlia ByesBerkelhammer, Ruth [facility] 12/29/2016, 2:04 PM

## 2016-12-29 NOTE — Anesthesia Postprocedure Evaluation (Addendum)
Anesthesia Post Note  Patient: Katie Deleon  Procedure(s) Performed: Procedure(s) (LRB): CESAREAN SECTION (N/A)  Patient location during evaluation: Mother Baby Anesthesia Type: Spinal Level of consciousness: awake and alert and oriented Pain management: pain level controlled Vital Signs Assessment: post-procedure vital signs reviewed and stable Respiratory status: spontaneous breathing and nonlabored ventilation Cardiovascular status: stable Postop Assessment: no headache, no backache, spinal receding, patient able to bend at knees, no signs of nausea or vomiting and adequate PO intake Anesthetic complications: no        Last Vitals:  Vitals:   12/29/16 0954 12/29/16 1115  BP: 115/78 120/71  Pulse: 83 78  Resp: 16 18  Temp:  36.4 C    Last Pain:  Vitals:   12/29/16 0842  TempSrc:   PainSc: 3    Pain Goal:                 Laban EmperorMalinova,Nataliya Hristova

## 2016-12-30 ENCOUNTER — Encounter (HOSPITAL_COMMUNITY): Payer: Self-pay | Admitting: Obstetrics and Gynecology

## 2016-12-30 LAB — CBC
HCT: 30.7 % — ABNORMAL LOW (ref 36.0–46.0)
Hemoglobin: 10.6 g/dL — ABNORMAL LOW (ref 12.0–15.0)
MCH: 31.5 pg (ref 26.0–34.0)
MCHC: 34.5 g/dL (ref 30.0–36.0)
MCV: 91.1 fL (ref 78.0–100.0)
PLATELETS: 182 10*3/uL (ref 150–400)
RBC: 3.37 MIL/uL — AB (ref 3.87–5.11)
RDW: 13.7 % (ref 11.5–15.5)
WBC: 16 10*3/uL — ABNORMAL HIGH (ref 4.0–10.5)

## 2016-12-30 LAB — BIRTH TISSUE RECOVERY COLLECTION (PLACENTA DONATION)

## 2016-12-30 NOTE — Lactation Note (Signed)
This note was copied from a baby's chart. Lactation Consultation Note  Patient Name: Katie Deleon [id number]'XToday's Date: 12/30/2016 Reason for consult: Follow-up assessment  Follow up visit at 35 hours of age.  Baby has had 14 feedings with 3 stools and 1 void in past 24 hours.  FOB reports scant blue mark on outside of diaper recently.  LC encouraged FOB to monitor output for weight and color in diaper.   Mom holding baby in cradle hold, baby sleepy after about 10 minutes of feeding already.  Mom identifies swallows and feels baby is doing well.  LC noted non nutritive sucking and encouraged mom to keep baby active and compress breasts during feedings to allow for more efficient feedings.  Mom denies pain or concerns at this time.     Maternal Data    Feeding Feeding Type: Breast Fed Length of feed:  (already in progress about 20 minutes total)  LATCH Score/Interventions Latch: Repeated attempts needed to sustain latch, nipple held in mouth throughout feeding, stimulation needed to elicit sucking reflex. Intervention(s): Adjust position;Assist with latch;Breast massage;Breast compression  Audible Swallowing: A few with stimulation Intervention(s): Hand expression  Type of Nipple: Everted at rest and after stimulation  Comfort (Breast/Nipple): Soft / non-tender     Hold (Positioning): Assistance needed to correctly position infant at breast and maintain latch. Intervention(s): Breastfeeding basics reviewed;Support Pillows;Position options;Skin to skin  LATCH Score: 7  Lactation Tools Discussed/Used     Consult Status Consult Status: Follow-up Date: 12/31/16 Follow-up type: In-patient    Shoptaw, Arvella MerlesJana Lynn 12/30/2016, 7:18 PM

## 2016-12-30 NOTE — Progress Notes (Signed)
Subjective: Postpartum Day 1: Cesarean Delivery Patient reports tolerating PO, + flatus and no problems voiding.    Objective: Vital signs in last 24 hours: Temp:  [97.4 F (36.3 C)-98.4 F (36.9 C)] 98.3 F (36.8 C) (05/30 0520) Pulse Rate:  [66-104] 81 (05/30 0520) Resp:  [12-21] 16 (05/30 0520) BP: (96-136)/(31-93) 106/52 (05/30 0520) SpO2:  [96 %-100 %] 97 % (05/30 0520)  Physical Exam:  General: alert and cooperative Lochia: appropriate Uterine Fundus: firm Incision: healing well DVT Evaluation: No evidence of DVT seen on physical exam. Negative Homan's sign. No cords or calf tenderness.   Recent Labs  12/27/16 1030 12/30/16 0512  HGB 12.3 10.6*  HCT 36.6 30.7*    Assessment/Plan: Status post Cesarean section. Doing well postoperatively.  Continue current care.  Tanara Turvey G 12/30/2016, 7:52 AM

## 2016-12-31 MED ORDER — OXYCODONE HCL 5 MG PO TABS: 5.0000 mg | ORAL_TABLET | Freq: Four times a day (QID) | ORAL | 0 refills | Status: AC | PRN

## 2016-12-31 MED ORDER — IBUPROFEN 600 MG PO TABS: 600.0000 mg | ORAL_TABLET | Freq: Four times a day (QID) | ORAL | 0 refills | Status: AC | PRN

## 2016-12-31 NOTE — Lactation Note (Signed)
This note was copied from a baby's chart. Lactation Consultation Note: Mother reports that infant is breastfeeding well. She reports that her nipples are slightly sore. Mother advised to use nipple to nose technique when latching infant and advised to use firm support. Mother reports that her breast are starting to fill firm. Advised mother to massage breast well while feeding. Advised to rouse infant well with skin to skin if infant sleepy . Mother advised to continue to cue base feed infant and at least 8-12 times in 24 hours. Discussed cluster feeding. Mother is aware of available LC services, BFS[facility] and outpatient dept.   Patient Name: Katie Deleon [id number]'XToday's Date: 12/31/2016 Reason for consult: Follow-up assessment   Maternal Data    Feeding Feeding Type: Breast Fed Length of feed: 20 min  LATCH Score/Interventions                      Lactation Tools Discussed/Used     Consult Status Consult Status: Complete    Michel BickersKendrick, Rainey Kahrs McCoy 12/31/2016, 10:42 AM

## 2016-12-31 NOTE — Discharge Summary (Signed)
Obstetric Discharge Summary Reason for Admission: cesarean section Prenatal Procedures: none Intrapartum Procedures: cesarean: low cervical, transverse Postpartum Procedures: none Complications-Operative and Postpartum: none Hemoglobin  Date Value Ref Range Status  12/30/2016 10.6 (L) 12.0 - 15.0 g/dL Final   HCT  Date Value Ref Range Status  12/30/2016 30.7 (L) 36.0 - 46.0 % Final    Physical Exam:  General: alert, cooperative and no distress Lochia: appropriate Uterine Fundus: firm Incision: healing well DVT Evaluation: No evidence of DVT seen on physical exam.  Discharge Diagnoses: Term Pregnancy-delivered  Discharge Information: Date: 12/31/2016 Activity: pelvic rest Diet: routine Medications: PNV, Ibuprofen and oxycodone Condition: stable Instructions: refer to practice specific booklet Discharge to: home   Newborn Data: Live born female  Birth Weight: 8 lb 6.8 oz (3820 g) APGAR: 9, 9  Home with mother.  Cressida Milford II,Alicia Seib E 12/31/2016, 8:39 AM

## 2016-12-31 NOTE — Lactation Note (Signed)
This note was copied from a baby's chart. Lactation Consultation Note: Assist with postioning infant in cross cradle. Taught mother how to latch infant using nipple  To nose technique. Infant latched on with wide open mouth. Observed frequent audible suckling and swallows. Mother denies having any pain with latch. Mother taught breast compression. Observed frequent suckles. Mother advised to continue to breastfeed on cue . Mother denies having any questions or concerns.   Patient Name: Katie Katie Deleon UYQIH'KToday's Date: 12/31/2016 Reason for consult: Follow-up assessment   Maternal Data    Feeding Feeding Type: Breast Fed Length of feed: 20 min  LATCH Score/Interventions Latch: Grasps breast easily, tongue down, lips flanged, rhythmical sucking. Intervention(s): Adjust position;Assist with latch;Breast compression  Audible Swallowing: Spontaneous and intermittent Intervention(s): Hand expression  Type of Nipple: Everted at rest and after stimulation  Comfort (Breast/Nipple): Filling, red/small blisters or bruises, mild/mod discomfort  Problem noted: Filling Interventions (Filling): Firm support  Hold (Positioning): No assistance needed to correctly position infant at breast. Intervention(s): Support Pillows;Position options  LATCH Score: 9  Lactation Tools Discussed/Used     Consult Status Consult Status: Complete    Michel BickersKendrick, Gates Jividen McCoy 12/31/2016, 11:54 AM

## 2016-12-31 NOTE — Progress Notes (Signed)
Subjective: Postpartum Day 2: Cesarean Delivery Patient reports tolerating PO, + flatus and no problems voiding.    Objective: Vital signs in last 24 hours: Temp:  [98.4 F (36.9 C)] 98.4 F (36.9 C) (05/30 1747) Pulse Rate:  [92] 92 (05/30 1747) Resp:  [18] 18 (05/30 1747) BP: (105)/(63) 105/63 (05/30 1747)  Physical Exam:  General: alert and cooperative Lochia: appropriate Uterine Fundus: firm Incision: healing well DVT Evaluation: No evidence of DVT seen on physical exam. Negative Homan's sign. No cords or calf tenderness. Calf/Ankle edema is present.   Recent Labs  12/30/16 0512  HGB 10.6*  HCT 30.7*    Assessment/Plan: Status post Cesarean section. Doing well postoperatively.  Discharge home with standard precautions and return to clinic in 1-2 weeks.  Larri Yehle G 12/31/2016, 8:39 AM

## 2017-02-12 NOTE — Addendum Note (Signed)
Addendum  created 02/12/17 1124 by Lewie LoronGermeroth, Renetta Suman, MD   Sign clinical note
